# Patient Record
Sex: Female | Born: 1975 | Hispanic: No | Smoking: Former smoker
Health system: Southern US, Community
[De-identification: ages and names within clinical notes are randomized; demographics above are authoritative.]

## PROBLEM LIST (undated history)

## (undated) DIAGNOSIS — F419 Anxiety disorder, unspecified: Secondary | ICD-10-CM

## (undated) DIAGNOSIS — F32A Depression, unspecified: Secondary | ICD-10-CM

## (undated) DIAGNOSIS — F329 Major depressive disorder, single episode, unspecified: Secondary | ICD-10-CM

---

## 1997-03-15 ENCOUNTER — Encounter (HOSPITAL_COMMUNITY): Admission: AD | Admit: 1997-03-15 | Discharge: 1997-03-25 | Payer: Self-pay | Admitting: Obstetrics

## 1997-03-23 ENCOUNTER — Inpatient Hospital Stay (HOSPITAL_COMMUNITY): Admission: AD | Admit: 1997-03-23 | Discharge: 1997-03-27 | Payer: Self-pay | Admitting: Obstetrics

## 1997-08-24 ENCOUNTER — Observation Stay (HOSPITAL_COMMUNITY): Admission: AD | Admit: 1997-08-24 | Discharge: 1997-08-25 | Payer: Self-pay | Admitting: Obstetrics

## 1997-08-24 ENCOUNTER — Encounter: Payer: Self-pay | Admitting: Obstetrics

## 1997-08-25 ENCOUNTER — Encounter: Payer: Self-pay | Admitting: Obstetrics

## 1997-10-06 ENCOUNTER — Ambulatory Visit (HOSPITAL_COMMUNITY): Admission: RE | Admit: 1997-10-06 | Discharge: 1997-10-06 | Payer: Self-pay | Admitting: *Deleted

## 1998-02-07 ENCOUNTER — Ambulatory Visit (HOSPITAL_COMMUNITY): Admission: RE | Admit: 1998-02-07 | Discharge: 1998-02-07 | Payer: Self-pay | Admitting: *Deleted

## 1998-02-07 ENCOUNTER — Encounter: Payer: Self-pay | Admitting: *Deleted

## 1998-03-12 ENCOUNTER — Inpatient Hospital Stay (HOSPITAL_COMMUNITY): Admission: AD | Admit: 1998-03-12 | Discharge: 1998-03-12 | Payer: Self-pay | Admitting: Obstetrics & Gynecology

## 1998-03-15 ENCOUNTER — Inpatient Hospital Stay (HOSPITAL_COMMUNITY): Admission: AD | Admit: 1998-03-15 | Discharge: 1998-03-17 | Payer: Self-pay | Admitting: *Deleted

## 1998-07-21 ENCOUNTER — Emergency Department (HOSPITAL_COMMUNITY): Admission: EM | Admit: 1998-07-21 | Discharge: 1998-07-21 | Payer: Self-pay | Admitting: Emergency Medicine

## 1998-11-10 ENCOUNTER — Emergency Department (HOSPITAL_COMMUNITY): Admission: EM | Admit: 1998-11-10 | Discharge: 1998-11-10 | Payer: Self-pay | Admitting: Emergency Medicine

## 1998-12-04 ENCOUNTER — Emergency Department (HOSPITAL_COMMUNITY): Admission: EM | Admit: 1998-12-04 | Discharge: 1998-12-04 | Payer: Self-pay | Admitting: Emergency Medicine

## 1999-01-26 ENCOUNTER — Other Ambulatory Visit: Admission: RE | Admit: 1999-01-26 | Discharge: 1999-01-26 | Payer: Self-pay | Admitting: *Deleted

## 2000-04-17 ENCOUNTER — Other Ambulatory Visit: Admission: RE | Admit: 2000-04-17 | Discharge: 2000-04-17 | Payer: Self-pay | Admitting: *Deleted

## 2000-10-29 ENCOUNTER — Other Ambulatory Visit: Admission: RE | Admit: 2000-10-29 | Discharge: 2000-10-29 | Payer: Self-pay | Admitting: *Deleted

## 2000-10-29 ENCOUNTER — Other Ambulatory Visit: Admission: RE | Admit: 2000-10-29 | Discharge: 2000-10-29 | Payer: Self-pay | Admitting: Family Medicine

## 2001-01-05 ENCOUNTER — Ambulatory Visit (HOSPITAL_COMMUNITY): Admission: RE | Admit: 2001-01-05 | Discharge: 2001-01-05 | Payer: Self-pay | Admitting: Family Medicine

## 2001-01-05 ENCOUNTER — Encounter: Payer: Self-pay | Admitting: Family Medicine

## 2001-09-11 ENCOUNTER — Inpatient Hospital Stay (HOSPITAL_COMMUNITY): Admission: AD | Admit: 2001-09-11 | Discharge: 2001-09-11 | Payer: Self-pay | Admitting: Obstetrics and Gynecology

## 2002-02-07 ENCOUNTER — Emergency Department (HOSPITAL_COMMUNITY): Admission: EM | Admit: 2002-02-07 | Discharge: 2002-02-07 | Payer: Self-pay | Admitting: Emergency Medicine

## 2002-02-07 ENCOUNTER — Encounter: Payer: Self-pay | Admitting: Emergency Medicine

## 2002-12-21 ENCOUNTER — Emergency Department (HOSPITAL_COMMUNITY): Admission: EM | Admit: 2002-12-21 | Discharge: 2002-12-21 | Payer: Self-pay | Admitting: *Deleted

## 2003-03-23 ENCOUNTER — Emergency Department (HOSPITAL_COMMUNITY): Admission: EM | Admit: 2003-03-23 | Discharge: 2003-03-23 | Payer: Self-pay | Admitting: Emergency Medicine

## 2003-11-15 ENCOUNTER — Inpatient Hospital Stay (HOSPITAL_COMMUNITY): Admission: AD | Admit: 2003-11-15 | Discharge: 2003-11-15 | Payer: Self-pay | Admitting: Obstetrics & Gynecology

## 2004-04-07 ENCOUNTER — Inpatient Hospital Stay (HOSPITAL_COMMUNITY): Admission: AD | Admit: 2004-04-07 | Discharge: 2004-04-07 | Payer: Self-pay | Admitting: Obstetrics and Gynecology

## 2004-04-23 ENCOUNTER — Emergency Department (HOSPITAL_COMMUNITY): Admission: EM | Admit: 2004-04-23 | Discharge: 2004-04-23 | Payer: Self-pay | Admitting: *Deleted

## 2004-05-22 ENCOUNTER — Inpatient Hospital Stay (HOSPITAL_COMMUNITY): Admission: AD | Admit: 2004-05-22 | Discharge: 2004-05-22 | Payer: Self-pay | Admitting: *Deleted

## 2004-06-29 ENCOUNTER — Emergency Department (HOSPITAL_COMMUNITY): Admission: EM | Admit: 2004-06-29 | Discharge: 2004-06-29 | Payer: Self-pay | Admitting: Emergency Medicine

## 2004-07-04 ENCOUNTER — Ambulatory Visit (HOSPITAL_COMMUNITY): Admission: RE | Admit: 2004-07-04 | Discharge: 2004-07-04 | Payer: Self-pay | Admitting: Obstetrics & Gynecology

## 2004-11-03 ENCOUNTER — Inpatient Hospital Stay (HOSPITAL_COMMUNITY): Admission: AD | Admit: 2004-11-03 | Discharge: 2004-11-05 | Payer: Self-pay | Admitting: Obstetrics & Gynecology

## 2004-11-27 ENCOUNTER — Emergency Department (HOSPITAL_COMMUNITY): Admission: EM | Admit: 2004-11-27 | Discharge: 2004-11-27 | Payer: Self-pay | Admitting: Emergency Medicine

## 2005-11-01 ENCOUNTER — Emergency Department (HOSPITAL_COMMUNITY): Admission: EM | Admit: 2005-11-01 | Discharge: 2005-11-01 | Payer: Self-pay | Admitting: Family Medicine

## 2008-08-12 ENCOUNTER — Inpatient Hospital Stay (HOSPITAL_COMMUNITY): Admission: AD | Admit: 2008-08-12 | Discharge: 2008-08-12 | Payer: Self-pay | Admitting: Obstetrics & Gynecology

## 2008-08-15 ENCOUNTER — Inpatient Hospital Stay (HOSPITAL_COMMUNITY): Admission: AD | Admit: 2008-08-15 | Discharge: 2008-08-15 | Payer: Self-pay | Admitting: Obstetrics & Gynecology

## 2008-08-15 ENCOUNTER — Encounter: Payer: Self-pay | Admitting: Obstetrics & Gynecology

## 2008-09-22 ENCOUNTER — Ambulatory Visit: Payer: Self-pay | Admitting: Obstetrics and Gynecology

## 2008-09-22 ENCOUNTER — Encounter: Payer: Self-pay | Admitting: Obstetrics and Gynecology

## 2010-02-13 ENCOUNTER — Inpatient Hospital Stay (HOSPITAL_COMMUNITY): Payer: Self-pay

## 2010-02-13 ENCOUNTER — Inpatient Hospital Stay (HOSPITAL_COMMUNITY)
Admission: AD | Admit: 2010-02-13 | Discharge: 2010-02-13 | Disposition: A | Discharge status: Home / Self Care | Payer: Self-pay | Source: Ambulatory Visit | Attending: Obstetrics and Gynecology | Admitting: Obstetrics and Gynecology

## 2010-02-13 DIAGNOSIS — R109 Unspecified abdominal pain: Secondary | ICD-10-CM

## 2010-02-13 DIAGNOSIS — N938 Other specified abnormal uterine and vaginal bleeding: Secondary | ICD-10-CM | POA: Insufficient documentation

## 2010-02-13 DIAGNOSIS — N949 Unspecified condition associated with female genital organs and menstrual cycle: Secondary | ICD-10-CM | POA: Insufficient documentation

## 2010-02-13 LAB — URINALYSIS, ROUTINE W REFLEX MICROSCOPIC
Bilirubin Urine: NEGATIVE
Ketones, ur: NEGATIVE mg/dL
Protein, ur: NEGATIVE mg/dL
Specific Gravity, Urine: 1.01 (ref 1.005–1.030)
pH: 6 (ref 5.0–8.0)

## 2010-02-13 LAB — URINE MICROSCOPIC-ADD ON

## 2010-02-13 LAB — WET PREP, GENITAL

## 2010-04-14 LAB — CBC
HCT: 40.7 % (ref 36.0–46.0)
Hemoglobin: 14.1 g/dL (ref 12.0–15.0)
Platelets: 209 10*3/uL (ref 150–400)
RDW: 13.5 % (ref 11.5–15.5)

## 2010-04-14 LAB — GC/CHLAMYDIA PROBE AMP, GENITAL: Chlamydia, DNA Probe: NEGATIVE

## 2010-04-14 LAB — WET PREP, GENITAL
Clue Cells Wet Prep HPF POC: NONE SEEN
Trich, Wet Prep: NONE SEEN
Yeast Wet Prep HPF POC: NONE SEEN

## 2010-05-25 NOTE — H&P (Signed)
NAME:  Sarah Turner, Sarah Turner                ACCOUNT NO.:  [id number]   MEDICAL RECORD NO.:  [id number]          PATIENT TYPE:  INP   LOCATION:  9164                          FACILITY:  [facility]   PHYSICIAN:  Roseanna Rainbow, M.D.DATE OF BIRTH:  1975-12-09   DATE OF ADMISSION:  11/03/2004  DATE OF DISCHARGE:                                HISTORY & PHYSICAL   CHIEF COMPLAINT:  The patient is a 35 year old para 2 with an estimated date  of confinement of October 30, with an intrauterine pregnancy at 39+ weeks,  complaining of regular uterine contractions.   HISTORY OF PRESENT ILLNESS:  Please see the above.   PAST OBSTETRICAL AND GYNECOLOGIC HISTORY:  1.  She has history of abnormal Pap smears.  2.  In 2000, she was delivered of a live-born female, weight 6 pounds 6      ounces, full term vaginal delivery.  3.  In March 1999, she was delivered of a live-born female, 7 pounds 15      ounces, full term by cesarean delivery secondary to a suspicious fetal      heart tracing.   PAST MEDICAL HISTORY:  Asthma.   PAST SURGICAL HISTORY:  Please see the above.   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  Does  not give any significant history of alcohol usage.  She recently stopped  using cigarettes.  She denies illicit drug use.   FAMILY HISTORY:  No major illnesses known.   ALLERGIES:  No known drug allergies.   MEDICATIONS:  Prenatal vitamins.   ANTEPARTUM COURSE/PROBLEMS/RISKS:  History of a previous cesarean delivery  with a successful VBAC.   PRENATAL SCREENS:  Antibody screen negative, blood type O positive,  hemoglobin 11.6, hematocrit 33.8, platelets 212,000.  GC and Chlamydia  negative.  GBS negative on October 12.  One hour GCT 90.  Hepatitis B  surface antigen negative.  HIV negative.  Pap smear within normal limits.  RPR nonreactive,  rubella immune, urine culture and sensitivity no growth.  Ultrasound on October 28, no previa, normal amniotic fluid,  mean gestational  age [redacted] weeks 6 days, normal anatomy and consistent with dates.   PHYSICAL EXAMINATION:  VITAL SIGNS:  Stable, afebrile, fetal heart tracing  reassuring, tocodynamometer monitor uterine contractions every 10 minutes.  GENERAL:  Well-developed, mild distress.  ABDOMEN:  Gravid.  STERILE VAGINAL EXAM PER THE RN:  4 cm dilated, 80% effaced.   ASSESSMENT:  1.  Intrauterine pregnancy at term.  2.  History of a previous cesarean delivery and successful VBAC, now with      late latent labor versus early active labor.  3.  Fetal heart tracing consistent with fetal well-being.   PLAN:  1.  Admission.  2.  Expectant management.      Roseanna Rainbow, M.D.  Electronically Signed     LAJ/MEDQ  D:  11/03/2004  T:  11/03/2004  Job:  [id number]

## 2011-06-05 ENCOUNTER — Inpatient Hospital Stay (HOSPITAL_COMMUNITY)
Admission: AD | Admit: 2011-06-05 | Discharge: 2011-06-05 | Disposition: A | Discharge status: Home / Self Care | Payer: Medicaid Other | Source: Ambulatory Visit | Attending: Obstetrics & Gynecology | Admitting: Obstetrics & Gynecology

## 2011-06-05 ENCOUNTER — Encounter (HOSPITAL_COMMUNITY): Payer: Self-pay

## 2011-06-05 ENCOUNTER — Inpatient Hospital Stay (HOSPITAL_COMMUNITY): Payer: Medicaid Other

## 2011-06-05 DIAGNOSIS — IMO0002 Reserved for concepts with insufficient information to code with codable children: Secondary | ICD-10-CM

## 2011-06-05 DIAGNOSIS — O26859 Spotting complicating pregnancy, unspecified trimester: Secondary | ICD-10-CM

## 2011-06-05 DIAGNOSIS — O021 Missed abortion: Secondary | ICD-10-CM | POA: Insufficient documentation

## 2011-06-05 HISTORY — DX: Major depressive disorder, single episode, unspecified: F32.9

## 2011-06-05 HISTORY — DX: Anxiety disorder, unspecified: F41.9

## 2011-06-05 HISTORY — DX: Depression, unspecified: F32.A

## 2011-06-05 LAB — CBC
MCV: 90 fL (ref 78.0–100.0)
Platelets: 203 10*3/uL (ref 150–400)
RBC: 4.7 MIL/uL (ref 3.87–5.11)
RDW: 13.8 % (ref 11.5–15.5)
WBC: 7.5 10*3/uL (ref 4.0–10.5)

## 2011-06-05 LAB — URINALYSIS, ROUTINE W REFLEX MICROSCOPIC
Bilirubin Urine: NEGATIVE
Ketones, ur: NEGATIVE mg/dL
Nitrite: NEGATIVE
Protein, ur: NEGATIVE mg/dL
Specific Gravity, Urine: >1.03 — ABNORMAL HIGH (ref 1.005–1.030)
Urobilinogen, UA: 0.2 mg/dL (ref 0.0–1.0)

## 2011-06-05 LAB — WET PREP, GENITAL
Trich, Wet Prep: NONE SEEN
Yeast Wet Prep HPF POC: NONE SEEN

## 2011-06-05 LAB — URINE MICROSCOPIC-ADD ON

## 2011-06-05 MED ORDER — PROMETHAZINE HCL 25 MG PO TABS: 12.5000 mg | ORAL_TABLET | Freq: Four times a day (QID) | ORAL | Status: DC | PRN

## 2011-06-05 MED ORDER — HYDROCODONE-ACETAMINOPHEN 5-325 MG PO TABS: 2.0000 | ORAL_TABLET | ORAL | Status: AC | PRN

## 2011-06-05 MED ORDER — IBUPROFEN 600 MG PO TABS: 600.0000 mg | ORAL_TABLET | Freq: Four times a day (QID) | ORAL | Status: AC | PRN

## 2011-06-05 NOTE — MAU Note (Signed)
Called to [facility] for follow up appt. Scheduled June 17,2013 @ 2:15

## 2011-06-05 NOTE — Discharge Instructions (Signed)
Follow up with the GYN Clinic in 2 weeks. 06/24/11 at 2:15 pm. Return here sooner for any problems.

## 2011-06-05 NOTE — MAU Note (Signed)
Pt states began spotting yesterday night, pink d/c mixed with watery discharge. Denies itching or burning, no uti s/s. Has had intermittent lower abd pain, sporadic, denies pain at present. Last intercourse 4 days ago.

## 2011-06-05 NOTE — MAU Note (Signed)
Unable to obtain FHT's with doppler

## 2011-06-05 NOTE — MAU Provider Note (Signed)
History     CSN: [id number]  Arrival date & time 06/05/11  0943   None     Chief Complaint  Patient presents with  . Vaginal Bleeding    HPI Sarah Turner is a 36 y.o. female @ [redacted]w[redacted]d gestation who presents to MAU for bleeding in pregnancy. The bleeding started last night.started as spotting and has continued this morning with abdominal cramping. Last sexual intercourse 3 days ago. Last pap smear in March was normal. Current sex partner x 18 years. No history of STI's. The history was provided by the patient.  Past Medical History  Diagnosis Date  . Hx successful VBAC (vaginal birth after cesarean), currently pregnant   . Vitamin d deficiency   . Asthma   . Anxiety   . Depression     Past Surgical History  Procedure Date  . Cesarean section     fetal distress    Family History  Problem Relation Age of Onset  . Anesthesia problems Neg Hx   . Hypotension Neg Hx   . Malignant hyperthermia Neg Hx   . Pseudochol deficiency Neg Hx     History  Substance Use Topics  . Smoking status: Former Games developer  . Smokeless tobacco: Never Used  . Alcohol Use: No    OB History    Grav Para Term Preterm Abortions TAB SAB Ect Mult Living   14 3 3  10  10   3       Review of Systems  Constitutional: Positive for fatigue. Negative for fever, chills and diaphoresis.  HENT: Negative for ear pain, congestion, sore throat, facial swelling, neck pain, neck stiffness, dental problem and sinus pressure.   Eyes: Negative for photophobia, pain, discharge and visual disturbance.  Respiratory: Negative for cough, chest tightness and wheezing.   Cardiovascular: Negative for chest pain and palpitations.  Gastrointestinal: Positive for abdominal pain (cramping). Negative for nausea, vomiting, diarrhea, constipation and abdominal distention.  Genitourinary: Positive for vaginal bleeding and vaginal discharge. Negative for dysuria, urgency, frequency, flank pain and difficulty urinating.    Musculoskeletal: Negative for myalgias, back pain and gait problem.  Skin: Negative for color change and rash.  Neurological: Negative for dizziness, speech difficulty, weakness, light-headedness, numbness and headaches.  Psychiatric/Behavioral: Negative for confusion and agitation. The patient is not nervous/anxious.     Allergies  Review of patient's allergies indicates no known allergies.  Home Medications  No current outpatient prescriptions on file.  BP 110/61  Pulse 88  Temp(Src) 98.3 F (36.8 C) (Oral)  Resp 16  Ht 5\' 6"  (1.676 m)  Wt 180 lb 2 oz (81.704 kg)  BMI 29.07 kg/m2  SpO2 100%  LMP 02/03/2011  Physical Exam  Nursing note and vitals reviewed. Constitutional: She is oriented to person, place, and time. She appears well-developed and well-nourished.  HENT:  Head: Normocephalic.  Eyes: EOM are normal.  Neck: Neck supple.  Cardiovascular: Normal rate.   Pulmonary/Chest: Effort normal.  Abdominal: Soft. There is no tenderness.  Musculoskeletal: Normal range of motion.  Neurological: She is alert and oriented to person, place, and time. No cranial nerve deficit.  Skin: Skin is warm and dry.  Psychiatric: She has a normal mood and affect. Her behavior is normal. Judgment and thought content normal.   Results for orders placed during the hospital encounter of 06/05/11 (from the past 24 hour(s))  URINALYSIS, ROUTINE W REFLEX MICROSCOPIC     Status: Abnormal   Collection Time   06/05/11  9:45 AM  Component Value Range   Color, Urine YELLOW  YELLOW    APPearance CLOUDY (*) CLEAR    Specific Gravity, Urine >1.030 (*) 1.005 - 1.030    pH 5.5  5.0 - 8.0    Glucose, UA NEGATIVE  NEGATIVE (mg/dL)   Hgb urine dipstick LARGE (*) NEGATIVE    Bilirubin Urine NEGATIVE  NEGATIVE    Ketones, ur NEGATIVE  NEGATIVE (mg/dL)   Protein, ur NEGATIVE  NEGATIVE (mg/dL)   Urobilinogen, UA 0.2  0.0 - 1.0 (mg/dL)   Nitrite NEGATIVE  NEGATIVE    Leukocytes, UA TRACE (*)  NEGATIVE   URINE MICROSCOPIC-ADD ON     Status: Abnormal   Collection Time   06/05/11  9:45 AM      Component Value Range   Squamous Epithelial / LPF RARE  RARE    WBC, UA 3-6  <3 (WBC/hpf)   Bacteria, UA MANY (*) RARE   POCT PREGNANCY, URINE     Status: Abnormal   Collection Time   06/05/11 11:06 AM      Component Value Range   Preg Test, Ur POSITIVE (*) NEGATIVE   CBC     Status: Normal   Collection Time   06/05/11 11:40 AM      Component Value Range   WBC 7.5  4.0 - 10.5 (K/uL)   RBC 4.70  3.87 - 5.11 (MIL/uL)   Hemoglobin 14.5  12.0 - 15.0 (g/dL)   HCT 40.9  81.1 - 91.4 (%)   MCV 90.0  78.0 - 100.0 (fL)   MCH 30.9  26.0 - 34.0 (pg)   MCHC 34.3  30.0 - 36.0 (g/dL)   RDW 78.2  95.6 - 21.3 (%)   Platelets 203  150 - 400 (K/uL)  WET PREP, GENITAL     Status: Abnormal   Collection Time   06/05/11 12:38 PM      Component Value Range   Yeast Wet Prep HPF POC NONE SEEN  NONE SEEN    Trich, Wet Prep NONE SEEN  NONE SEEN    Clue Cells Wet Prep HPF POC FEW (*) NONE SEEN    WBC, Wet Prep HPF POC FEW (*) NONE SEEN     ED Course  Procedures Ultrasound today shows a 9 week 2 day IUFD  MDM: discussed with Dr. Debroah Loop.    Assessment: IUFD @ 9 weeks and 2 days   Plan: Discussed options in detail with patient. She states that she has done expectant management with other miscarriages and wants to do that again.   Rx Vicodin  Rx Phenergan  Follow up in GYN office in 2 weeks  Return here for any problems

## 2011-06-06 LAB — GC/CHLAMYDIA PROBE AMP, GENITAL: GC Probe Amp, Genital: NEGATIVE

## 2011-06-07 ENCOUNTER — Inpatient Hospital Stay (HOSPITAL_COMMUNITY)
Admission: AD | Admit: 2011-06-07 | Discharge: 2011-06-07 | Disposition: A | Discharge status: Home / Self Care | Payer: Medicaid Other | Source: Ambulatory Visit | Attending: Obstetrics & Gynecology | Admitting: Obstetrics & Gynecology

## 2011-06-07 ENCOUNTER — Encounter (HOSPITAL_COMMUNITY): Payer: Self-pay | Admitting: *Deleted

## 2011-06-07 DIAGNOSIS — O021 Missed abortion: Secondary | ICD-10-CM

## 2011-06-07 DIAGNOSIS — R109 Unspecified abdominal pain: Secondary | ICD-10-CM | POA: Insufficient documentation

## 2011-06-07 DIAGNOSIS — IMO0002 Reserved for concepts with insufficient information to code with codable children: Secondary | ICD-10-CM | POA: Diagnosis present

## 2011-06-07 LAB — CBC
Hemoglobin: 13.1 g/dL (ref 12.0–15.0)
Platelets: 196 10*3/uL (ref 150–400)
RBC: 4.32 MIL/uL (ref 3.87–5.11)
WBC: 11.5 10*3/uL — ABNORMAL HIGH (ref 4.0–10.5)

## 2011-06-07 MED ORDER — HYDROCODONE-ACETAMINOPHEN 5-325 MG PO TABS
2.0000 | ORAL_TABLET | Freq: Once | ORAL | Status: AC
  Administered 2011-06-07: 1 via ORAL
  Filled 2011-06-07: qty 1

## 2011-06-07 MED ORDER — HYDROCODONE-ACETAMINOPHEN 5-325 MG PO TABS
1.0000 | ORAL_TABLET | Freq: Once | ORAL | Status: AC
  Administered 2011-06-07: 1 via ORAL
  Filled 2011-06-07: qty 1

## 2011-06-07 NOTE — Discharge Instructions (Signed)
Miscarriage  An early pregnancy loss or spontaneous abortion (miscarriage) is a common problem. This usually happens when the pregnancy is not developing normally. It is very unlikely that you or your partner did anything to cause this, although cigarette smoking, a sexually transmitted disease, excessive alcohol use, or drug abuse can increase the risk. Other causes are:   Abnormalities of the uterus.   Hormone or medical problems.   Trauma or genetic (chromosome) problems.  Having a miscarriage does not change your chances of having a normal pregnancy in the future. Your caregiver will advise you when it is safe to try to get pregnant again.  AFTER A MISCARRIAGE   A miscarriage is inevitable when there is continual, heavy vaginal bleeding; cramping; dilation of the cervix; or passing of any pregnancy tissue. Bleeding and cramping will usually continue until all the tissue has been removed from the womb (uterus).   Often the uterus does not clean itself out completely. A medication or a D&C procedure is needed to loosen or remove the pregnancy tissue from the uterus. A D&C scrapes or suctions the tissue out.   If you are RH negative, you may need to have Rh immune globulin to avoid Rh problems.   You may be given medication to fight an infection if the miscarriage was due to an infection.  HOME CARE INSTRUCTIONS    You should rest in bed for the next 2 to 3 days.   Do not take tub baths or put anything in your vagina, including tampons or a douche.   Do not have sex until your caregiver approves.   Avoid exercise or heavy activities until directed by your caregiver.   Save any vaginal discharge that looks like tissue. Ask your caregiver if he or she wants to inspect the discharge.   If you and your partner are having problems with guilt or grieving, talk to your caregiver or get counseling to help you understand and cope with your pregnancy loss.   Allow enough time to grieve before trying to get  pregnant again.  SEEK IMMEDIATE MEDICAL CARE IF:    You have persistent heavy bleeding or a bad smelling vaginal discharge.   You have continued abdominal or pelvic pain.   You have an oral temperature above 102 F (38.9 C), not controlled by medicine.   You have severe weakness, fainting, or keep throwing up (vomiting).   You develop chills.   You are experiencing domestic violence.  MAKE SURE YOU:    Understand these instructions.   Will watch your condition.   Will get help right away if you are not doing well or get worse.  Document Released: 02/01/2004 Document Revised: 12/13/2010 Document Reviewed: 12/17/2007  ExitCare Patient Information 2012 [employer].

## 2011-06-07 NOTE — MAU Provider Note (Signed)
Sarah Turner [id number] y.o.[id number] @[redacted]w[redacted]d  by LMP Chief Complaint  Patient presents with  . Abdominal Pain     First Provider Initiated Contact with Patient 06/07/11 2151      SUBJECTIVE  HPI: Bleeding since 06/05/2011 and today began cramping. Denies passing clots. May have passed tissue but describes it as stringy blood. Bleeding is less heavy than a period. Had ultrasound diagnosis of embryonic demise 9 week 2 days on 06/05/2011. States she was offered options of expectant management, Cytotec or D&C. She elected expectant management then but now "just wants it out."  She doesn't want Cytotec because she heard it causes a lot of pain. She is requesting D&C Had Vicodin about noon and requests pain med.   Past Medical History  Diagnosis Date  . Hx successful VBAC (vaginal birth after cesarean), currently pregnant   . Vitamin d deficiency   . Asthma   . Anxiety   . Depression    Past Surgical History  Procedure Date  . Cesarean section     fetal distress   History   Social History  . Marital Status: Legally Separated    Spouse Name: N/A    Number of Children: N/A  . Years of Education: N/A   Occupational History  . Not on file.   Social History Main Topics  . Smoking status: Former Games developer  . Smokeless tobacco: Never Used  . Alcohol Use: No  . Drug Use: No  . Sexually Active: Yes    Birth Control/ Protection: None   Other Topics Concern  . Not on file   Social History Narrative  . No narrative on file   No current facility-administered medications on file prior to encounter.   Current Outpatient Prescriptions on File Prior to Encounter  Medication Sig Dispense Refill  . acetaminophen (TYLENOL) 500 MG tablet Take 500 mg by mouth every 6 (six) hours as needed. headache      . HYDROcodone-acetaminophen (NORCO) 5-325 MG per tablet Take 2 tablets by mouth every 4 (four) hours as needed for pain.  10 tablet  0  . ibuprofen (ADVIL,MOTRIN) 600 MG tablet Take 1 tablet (600  mg total) by mouth every 6 (six) hours as needed for pain.  30 tablet  0  . promethazine (PHENERGAN) 25 MG tablet Take 0.5 tablets (12.5 mg total) by mouth every 6 (six) hours as needed for nausea.  20 tablet  0   No Known Allergies  ROS: Pertinent items in HPI  OBJECTIVE Blood pressure 114/65, pulse 75, temperature 98.5 F (36.9 C), temperature source Oral, resp. rate 22, height 5\' 6"  (1.676 m), weight 81.647 kg (180 lb), last menstrual period 02/03/2011.  GENERAL: Well-developed, well-nourished female in no acute distress.  HEENT: Normocephalic, good dentition HEART: normal rate RESP: normal effort ABDOMEN: Soft, nontender EXTREMITIES: Nontender, no edema NEURO: Alert and oriented SPECULUM EXAM: NEFG, sm-mod blood noted, cervix clean BIMANUAL: cervix ext 1/ long; uterus c/w 9 wk and sl tender   LAB RESULTS A pos CBC    Component Value Date/Time   WBC 11.5* 06/07/2011 2206   RBC 4.32 06/07/2011 2206   HGB 13.1 06/07/2011 2206   HCT 39.2 06/07/2011 2206   PLT 196 06/07/2011 2206   MCV 90.7 06/07/2011 2206   MCH 30.3 06/07/2011 2206   MCHC 33.4 06/07/2011 2206   RDW 13.4 06/07/2011 2206        IMAGING 06/05/11 9w 2d fetal demise  ASSESSMENT Multip with EPF, hemodynamically stable   PLAN Dr. Debroah Loop  saw pt.     POE,DEIRDRE 06/07/2011 10:12 PM  Patient requests scheduled dilation and curettage in AM. Declines expectant management and Cytotec.The procedure and risks of anesthesia, bleeding, pain,uterine or pelvic organ damage, infection were discussed and her questions were answered. She will present at 0600 06/08/11 for surgery at 0730.  Torah Pinnock 06/07/2011 10:42 PM

## 2011-06-07 NOTE — MAU Note (Signed)
C/o increased cramping and increased pain; was told Tuesday that she was in the process of misscarrying;

## 2011-06-08 ENCOUNTER — Telehealth (HOSPITAL_COMMUNITY): Payer: Self-pay

## 2011-06-08 ENCOUNTER — Ambulatory Visit: Admit: 2011-06-08 | Payer: Self-pay | Admitting: Obstetrics & Gynecology

## 2011-06-08 SURGERY — DILATION AND EVACUATION, UTERUS
Anesthesia: Choice

## 2011-06-08 NOTE — MAU Provider Note (Signed)
Scheduled for D&C

## 2011-06-24 ENCOUNTER — Ambulatory Visit: Payer: Self-pay | Admitting: Family

## 2011-07-17 ENCOUNTER — Ambulatory Visit (INDEPENDENT_AMBULATORY_CARE_PROVIDER_SITE_OTHER): Payer: Medicaid Other | Admitting: Obstetrics & Gynecology

## 2011-07-17 ENCOUNTER — Encounter: Payer: Self-pay | Admitting: Obstetrics & Gynecology

## 2011-07-17 VITALS — BP 124/88 | HR 74 | Temp 98.9°F | Resp 20 | Ht 66.0 in | Wt 176.1 lb

## 2011-07-17 DIAGNOSIS — O021 Missed abortion: Secondary | ICD-10-CM

## 2011-07-17 DIAGNOSIS — Z30430 Encounter for insertion of intrauterine contraceptive device: Secondary | ICD-10-CM

## 2011-07-17 DIAGNOSIS — IMO0002 Reserved for concepts with insufficient information to code with codable children: Secondary | ICD-10-CM

## 2011-07-17 DIAGNOSIS — Z975 Presence of (intrauterine) contraceptive device: Secondary | ICD-10-CM

## 2011-07-17 LAB — POCT PREGNANCY, URINE: Preg Test, Ur: NEGATIVE

## 2011-07-17 MED ORDER — LEVONORGESTREL 20 MCG/24HR IU IUD: 1.0000 | INTRAUTERINE_SYSTEM | Freq: Once | INTRAUTERINE | Status: AC

## 2011-07-17 NOTE — Patient Instructions (Signed)

## 2011-07-17 NOTE — Progress Notes (Signed)
Pt here for follow up of miscarriage- seen in MAU on 06/07/11.  She had unprotected intercourse 1 wk after miscarriage and used Plan B.  She had a light period on 07/10/11 which lasted 5 days, then had heavier bleeding on 07/15/11.  Pt desires surgical consult for tubal sterilization. Patient's last menstrual period was 07/10/2011. [id number] Patient had a spontaneous miscarriage at the end of May. She's had many spontaneous miscarriages and she would like to have effective for control. She has considered tubal ligation but also has considered Mirena. We discussed both methods of contraception. The risk and benefits were discussed. She would like have a Mirena placed. The procedure of insertion was explained. The risk of pain bleeding uterine perforation infection and possible future pregnancy were discussed. She had her questions about the possibility of expulsion answered. Past Medical History  Diagnosis Date  . Hx successful VBAC (vaginal birth after cesarean), currently pregnant   . Vitamin d deficiency   . Asthma   . Anxiety   . Depression    Past Surgical History  Procedure Date  . Cesarean section 1999    fetal distress   Allergies  Allergen Reactions  . Amoxicillin Itching   History   Social History  . Marital Status: Legally Separated    Spouse Name: N/A    Number of Children: N/A  . Years of Education: N/A   Occupational History  . Not on file.   Social History Main Topics  . Smoking status: Current Everyday Smoker -- 0.5 packs/day for .5 years  . Smokeless tobacco: Never Used  . Alcohol Use: No     occasionally  . Drug Use: No  . Sexually Active: Yes    Birth Control/ Protection: None     desires tubal sterilization   Other Topics Concern  . Not on file   Social History Narrative  . No narrative on file   review of systems: The patient is menstruating today. Filed Vitals:   07/17/11 1544  BP: 124/88  Pulse: 74  Temp: 98.9 F (37.2 C)  Resp: 20   No acute  distress normal affect Abdomen soft nontender no mass Pelvic: External genitalia vagina appeared normal. Moderate amount of menstrual blood. Uterus normal size no adnexal mass or tenderness. Cervix was prepped with Betadine and grasped with a single-tooth tenaculum. Uterus sounded to 8 cm. The Mirena was placed in the usual fashion without difficulty. The string was trimmed to 3 cm.  Impression: 6 weeks after spontaneous miscarriage. Patient requested on term contraception and Mirena was placed. She signed consent adequate timeout was performed.  Plan return in one month for IUD check.  Dr. Scheryl Darter 07/17/2011 4:38 PM

## 2011-08-29 ENCOUNTER — Encounter: Payer: Self-pay | Admitting: Obstetrics & Gynecology

## 2011-08-29 ENCOUNTER — Ambulatory Visit (INDEPENDENT_AMBULATORY_CARE_PROVIDER_SITE_OTHER): Payer: Medicaid Other | Admitting: Obstetrics & Gynecology

## 2011-08-29 VITALS — BP 109/71 | HR 82 | Temp 97.2°F | Ht 66.0 in | Wt 169.3 lb

## 2011-08-29 DIAGNOSIS — B9689 Other specified bacterial agents as the cause of diseases classified elsewhere: Secondary | ICD-10-CM

## 2011-08-29 DIAGNOSIS — N760 Acute vaginitis: Secondary | ICD-10-CM

## 2011-08-29 DIAGNOSIS — Z30431 Encounter for routine checking of intrauterine contraceptive device: Secondary | ICD-10-CM | POA: Insufficient documentation

## 2011-08-29 DIAGNOSIS — A499 Bacterial infection, unspecified: Secondary | ICD-10-CM

## 2011-08-29 MED ORDER — METRONIDAZOLE 500 MG PO TABS: 500.0000 mg | ORAL_TABLET | Freq: Two times a day (BID) | ORAL | Status: AC

## 2011-08-29 NOTE — Progress Notes (Signed)
  Subjective:    Patient ID: Sarah Turner, female    DOB: 05-31-1975, 36 y.o.   MRN: [id number]  [id number] Patient's last menstrual period was 07/17/2011. The patient had a Mirena placed at her last visit. She comes for followup. She's had some irregular bleeding but it has been light period no problems with pain. She has had a vaginal odor that she notices when she urinates. She's been treated for bacterial vaginosis in the past. Allergies  Allergen Reactions  . Amoxicillin Itching   Current Outpatient Prescriptions on File Prior to Visit  Medication Sig Dispense Refill  . citalopram (CELEXA) 20 MG tablet Take 40 mg by mouth daily.       Marland Kitchen levonorgestrel (MIRENA) 20 MCG/24HR IUD 1 Intra Uterine Device (1 each total) by Intrauterine route once.  1 each  0  . acetaminophen (TYLENOL) 500 MG tablet Take 500 mg by mouth every 6 (six) hours as needed. headache       Past Medical History  Diagnosis Date  . Hx successful VBAC (vaginal birth after cesarean), currently pregnant   . Vitamin d deficiency   . Asthma   . Anxiety   . Depression    '     Review of Systems  Genitourinary: Positive for vaginal bleeding (light). Negative for pelvic pain. Vaginal discharge: Vaginal odor.       Objective:   Physical Exam  Constitutional: She appears well-developed. No distress.  Pulmonary/Chest: Effort normal. No respiratory distress.  Genitourinary: Vagina normal and uterus normal. Vaginal discharge: slight vaginal bleeding.       IUD strings 2 cm long  Skin: Skin is warm and dry.  Psychiatric: She has a normal mood and affect.          Assessment & Plan:  Normal followup exam after Mirena insertion. Patient has symptoms of bacterial vaginosis. A prescribed Flagyl 500 mg by mouth twice a day for 7 days. She can return for routine gynecologic followup.  Dr. Scheryl Darter 08/29/2011 4:23 PM

## 2011-08-29 NOTE — Patient Instructions (Addendum)
Bacterial Vaginosis Bacterial vaginosis (BV) is a vaginal infection where the normal balance of bacteria in the vagina is disrupted. The normal balance is then replaced by an overgrowth of certain bacteria. There are several different kinds of bacteria that can cause BV. BV is the most common vaginal infection in women of childbearing age. CAUSES   The cause of BV is not fully understood. BV develops when there is an increase or imbalance of harmful bacteria.   Some activities or behaviors can upset the normal balance of bacteria in the vagina and put women at increased risk including:   Having a new sex partner or multiple sex partners.   Douching.   Using an intrauterine device (IUD) for contraception.   It is not clear what role sexual activity plays in the development of BV. However, women that have never had sexual intercourse are rarely infected with BV.  Women do not get BV from toilet seats, bedding, swimming pools or from touching objects around them.  SYMPTOMS   Grey vaginal discharge.   A fish-like odor with discharge, especially after sexual intercourse.   Itching or burning of the vagina and vulva.   Burning or pain with urination.   Some women have no signs or symptoms at all.  DIAGNOSIS  Your caregiver must examine the vagina for signs of BV. Your caregiver will perform lab tests and look at the sample of vaginal fluid through a microscope. They will look for bacteria and abnormal cells (clue cells), a pH test higher than 4.5, and a positive amine test all associated with BV.  RISKS AND COMPLICATIONS   Pelvic inflammatory disease (PID).   Infections following gynecology surgery.   Developing HIV.   Developing herpes virus.  TREATMENT  Sometimes BV will clear up without treatment. However, all women with symptoms of BV should be treated to avoid complications, especially if gynecology surgery is planned. Female partners generally do not need to be treated. However,  BV may spread between female sex partners so treatment is helpful in preventing a recurrence of BV.   BV may be treated with antibiotics. The antibiotics come in either pill or vaginal cream forms. Either can be used with nonpregnant or pregnant women, but the recommended dosages differ. These antibiotics are not harmful to the baby.   BV can recur after treatment. If this happens, a second round of antibiotics will often be prescribed.   Treatment is important for pregnant women. If not treated, BV can cause a premature delivery, especially for a pregnant woman who had a premature birth in the past. All pregnant women who have symptoms of BV should be checked and treated.   For chronic reoccurrence of BV, treatment with a type of prescribed gel vaginally twice a week is helpful.  HOME CARE INSTRUCTIONS   Finish all medication as directed by your caregiver.   Do not have sex until treatment is completed.   Tell your sexual partner that you have a vaginal infection. They should see their caregiver and be treated if they have problems, such as a mild rash or itching.   Practice safe sex. Use condoms. Only have 1 sex partner.  PREVENTION  Basic prevention steps can help reduce the risk of upsetting the natural balance of bacteria in the vagina and developing BV:  Do not have sexual intercourse (be abstinent).   Do not douche.   Use all of the medicine prescribed for treatment of BV, even if the signs and symptoms go away.     Tell your sex partner if you have BV. That way, they can be treated, if needed, to prevent reoccurrence.  SEEK MEDICAL CARE IF:   Your symptoms are not improving after 3 days of treatment.   You have increased discharge, pain, or fever.  MAKE SURE YOU:   Understand these instructions.   Will watch your condition.   Will get help right away if you are not doing well or get worse.  FOR MORE INFORMATION  Division of STD Prevention (DSTDP), Centers for Disease  Control and Prevention: www.cdc.gov/std American Social Health Association (ASHA): www.ashastd.org  Document Released: 12/24/2004 Document Revised: 12/13/2010 Document Reviewed: 06/16/2008 ExitCare Patient Information 2012 [employer]. 

## 2011-08-30 ENCOUNTER — Emergency Department (HOSPITAL_COMMUNITY)
Admission: EM | Admit: 2011-08-30 | Discharge: 2011-08-30 | Discharge status: Against Medical Advice | Payer: Medicaid Other | Attending: Emergency Medicine | Admitting: Emergency Medicine

## 2011-08-30 DIAGNOSIS — R209 Unspecified disturbances of skin sensation: Secondary | ICD-10-CM | POA: Insufficient documentation

## 2011-08-30 NOTE — ED Notes (Signed)
Pt seen leaving ED, called by Triage RN, no answer.

## 2012-05-30 ENCOUNTER — Emergency Department (HOSPITAL_COMMUNITY)
Admission: EM | Admit: 2012-05-30 | Discharge: 2012-05-30 | Disposition: A | Discharge status: Home / Self Care | Payer: No Typology Code available for payment source | Attending: Emergency Medicine | Admitting: Emergency Medicine

## 2012-05-30 ENCOUNTER — Encounter (HOSPITAL_COMMUNITY): Payer: Self-pay

## 2012-05-30 ENCOUNTER — Emergency Department (HOSPITAL_COMMUNITY): Payer: No Typology Code available for payment source

## 2012-05-30 DIAGNOSIS — J45909 Unspecified asthma, uncomplicated: Secondary | ICD-10-CM | POA: Insufficient documentation

## 2012-05-30 DIAGNOSIS — S335XXA Sprain of ligaments of lumbar spine, initial encounter: Secondary | ICD-10-CM | POA: Insufficient documentation

## 2012-05-30 DIAGNOSIS — Y9389 Activity, other specified: Secondary | ICD-10-CM | POA: Insufficient documentation

## 2012-05-30 DIAGNOSIS — S8000XA Contusion of unspecified knee, initial encounter: Secondary | ICD-10-CM | POA: Insufficient documentation

## 2012-05-30 DIAGNOSIS — S39012A Strain of muscle, fascia and tendon of lower back, initial encounter: Secondary | ICD-10-CM

## 2012-05-30 DIAGNOSIS — F17200 Nicotine dependence, unspecified, uncomplicated: Secondary | ICD-10-CM | POA: Insufficient documentation

## 2012-05-30 DIAGNOSIS — Z8639 Personal history of other endocrine, nutritional and metabolic disease: Secondary | ICD-10-CM | POA: Insufficient documentation

## 2012-05-30 DIAGNOSIS — S139XXA Sprain of joints and ligaments of unspecified parts of neck, initial encounter: Secondary | ICD-10-CM | POA: Insufficient documentation

## 2012-05-30 DIAGNOSIS — Z862 Personal history of diseases of the blood and blood-forming organs and certain disorders involving the immune mechanism: Secondary | ICD-10-CM | POA: Insufficient documentation

## 2012-05-30 DIAGNOSIS — Z880 Allergy status to penicillin: Secondary | ICD-10-CM | POA: Insufficient documentation

## 2012-05-30 DIAGNOSIS — Z8659 Personal history of other mental and behavioral disorders: Secondary | ICD-10-CM | POA: Insufficient documentation

## 2012-05-30 DIAGNOSIS — S8002XA Contusion of left knee, initial encounter: Secondary | ICD-10-CM

## 2012-05-30 DIAGNOSIS — Y92410 Unspecified street and highway as the place of occurrence of the external cause: Secondary | ICD-10-CM | POA: Insufficient documentation

## 2012-05-30 DIAGNOSIS — S161XXA Strain of muscle, fascia and tendon at neck level, initial encounter: Secondary | ICD-10-CM

## 2012-05-30 MED ORDER — IBUPROFEN 800 MG PO TABS: 800.0000 mg | ORAL_TABLET | Freq: Three times a day (TID) | ORAL | Status: DC | PRN

## 2012-05-30 MED ORDER — HYDROCODONE-ACETAMINOPHEN 5-325 MG PO TABS: 1.0000 | ORAL_TABLET | Freq: Four times a day (QID) | ORAL | Status: DC | PRN

## 2012-05-30 MED ORDER — HYDROCODONE-ACETAMINOPHEN 5-325 MG PO TABS
1.0000 | ORAL_TABLET | Freq: Once | ORAL | Status: AC
  Administered 2012-05-30: 1 via ORAL
  Filled 2012-05-30: qty 1

## 2012-05-30 NOTE — ED Notes (Signed)
Pt states she has a ride home. 
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
## 2012-05-30 NOTE — ED Provider Notes (Signed)
Medical screening examination/treatment/procedure(s) were performed by non-physician practitioner and as supervising physician I was immediately available for consultation/collaboration.    Celene Kras, MD 05/30/12 [phone]
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
## 2013-11-08 ENCOUNTER — Encounter (HOSPITAL_COMMUNITY): Payer: Self-pay
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
## 2014-09-13 ENCOUNTER — Encounter (HOSPITAL_COMMUNITY): Payer: Self-pay | Admitting: Emergency Medicine

## 2014-09-13 ENCOUNTER — Emergency Department (HOSPITAL_COMMUNITY)
Admission: EM | Admit: 2014-09-13 | Discharge: 2014-09-13 | Disposition: A | Discharge status: Home / Self Care | Payer: No Typology Code available for payment source | Attending: Emergency Medicine | Admitting: Emergency Medicine

## 2014-09-13 DIAGNOSIS — S39012A Strain of muscle, fascia and tendon of lower back, initial encounter: Secondary | ICD-10-CM | POA: Insufficient documentation

## 2014-09-13 DIAGNOSIS — Y9389 Activity, other specified: Secondary | ICD-10-CM | POA: Diagnosis not present

## 2014-09-13 DIAGNOSIS — Z880 Allergy status to penicillin: Secondary | ICD-10-CM | POA: Diagnosis not present

## 2014-09-13 DIAGNOSIS — T148XXA Other injury of unspecified body region, initial encounter: Secondary | ICD-10-CM

## 2014-09-13 DIAGNOSIS — Z720 Tobacco use: Secondary | ICD-10-CM | POA: Diagnosis not present

## 2014-09-13 DIAGNOSIS — J45909 Unspecified asthma, uncomplicated: Secondary | ICD-10-CM | POA: Insufficient documentation

## 2014-09-13 DIAGNOSIS — Y998 Other external cause status: Secondary | ICD-10-CM | POA: Insufficient documentation

## 2014-09-13 DIAGNOSIS — Z8659 Personal history of other mental and behavioral disorders: Secondary | ICD-10-CM | POA: Diagnosis not present

## 2014-09-13 DIAGNOSIS — Y92410 Unspecified street and highway as the place of occurrence of the external cause: Secondary | ICD-10-CM | POA: Insufficient documentation

## 2014-09-13 DIAGNOSIS — Z8639 Personal history of other endocrine, nutritional and metabolic disease: Secondary | ICD-10-CM | POA: Insufficient documentation

## 2014-09-13 DIAGNOSIS — S199XXA Unspecified injury of neck, initial encounter: Secondary | ICD-10-CM | POA: Diagnosis not present

## 2014-09-13 DIAGNOSIS — S3992XA Unspecified injury of lower back, initial encounter: Secondary | ICD-10-CM | POA: Diagnosis present

## 2014-09-13 MED ORDER — IBUPROFEN 800 MG PO TABS: 800.0000 mg | ORAL_TABLET | Freq: Three times a day (TID) | ORAL | Status: AC

## 2014-09-13 MED ORDER — IBUPROFEN 800 MG PO TABS
800.0000 mg | ORAL_TABLET | Freq: Once | ORAL | Status: AC
  Administered 2014-09-13: 800 mg via ORAL
  Filled 2014-09-13: qty 1

## 2014-09-13 MED ORDER — METHOCARBAMOL 500 MG PO TABS: 1000.0000 mg | ORAL_TABLET | Freq: Two times a day (BID) | ORAL | Status: DC
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
## 2014-09-13 NOTE — ED Notes (Signed)
Pt expressing concern with this RN about pain medication. Stating "If I don't get any relief and something for this pain, I will be back tomorrow. Tell the NP that I need stronger pain medication than Ibuprohen. This RN notified the NP.
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
## 2015-06-08 ENCOUNTER — Other Ambulatory Visit: Payer: Self-pay | Admitting: Family Medicine

## 2015-06-08 DIAGNOSIS — M7989 Other specified soft tissue disorders: Secondary | ICD-10-CM

## 2015-06-08 DIAGNOSIS — M79604 Pain in right leg: Secondary | ICD-10-CM

## 2015-06-09 ENCOUNTER — Ambulatory Visit
Admission: RE | Admit: 2015-06-09 | Discharge: 2015-06-09 | Disposition: A | Discharge status: Home / Self Care | Payer: BLUE CROSS/BLUE SHIELD | Source: Ambulatory Visit | Attending: Family Medicine | Admitting: Family Medicine

## 2015-06-09 DIAGNOSIS — M7989 Other specified soft tissue disorders: Secondary | ICD-10-CM

## 2015-06-09 DIAGNOSIS — M79604 Pain in right leg: Secondary | ICD-10-CM

## 2015-11-26 ENCOUNTER — Ambulatory Visit (HOSPITAL_COMMUNITY)
Admission: EM | Admit: 2015-11-26 | Discharge: 2015-11-26 | Disposition: A | Discharge status: Home / Self Care | Payer: BLUE CROSS/BLUE SHIELD | Attending: Emergency Medicine | Admitting: Emergency Medicine

## 2015-11-26 ENCOUNTER — Encounter (HOSPITAL_COMMUNITY): Payer: Self-pay | Admitting: *Deleted

## 2015-11-26 DIAGNOSIS — T148XXA Other injury of unspecified body region, initial encounter: Secondary | ICD-10-CM

## 2015-11-26 DIAGNOSIS — M545 Low back pain, unspecified: Secondary | ICD-10-CM

## 2015-11-26 DIAGNOSIS — M7918 Myalgia, other site: Secondary | ICD-10-CM

## 2015-11-26 DIAGNOSIS — M791 Myalgia: Secondary | ICD-10-CM

## 2015-11-26 DIAGNOSIS — S39012A Strain of muscle, fascia and tendon of lower back, initial encounter: Secondary | ICD-10-CM

## 2015-11-26 MED ORDER — DICLOFENAC POTASSIUM 50 MG PO TABS
50.0000 mg | ORAL_TABLET | Freq: Three times a day (TID) | ORAL | 0 refills | Status: DC
Start: 2015-11-26 — End: 2017-02-03

## 2015-11-26 MED ORDER — TRAMADOL HCL 50 MG PO TABS: 50.0000 mg | ORAL_TABLET | Freq: Four times a day (QID) | ORAL | 0 refills | Status: DC | PRN

## 2015-11-26 MED ORDER — METHOCARBAMOL 500 MG PO TABS: 500.0000 mg | ORAL_TABLET | Freq: Two times a day (BID) | ORAL | 0 refills | Status: DC
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
## 2015-11-26 NOTE — Discharge Instructions (Signed)
For the first couple days apply ice to the areas of soreness then switch to heat. Perform stretches as demonstrated. These are not exercises but no stretches. He will have pain over the next additional 2-3 days before gradually starting to get better. If needed follow-up with your primary care doctor. Some of the medicine you are being given may cause some drowsiness.
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
## 2015-11-28 ENCOUNTER — Encounter (HOSPITAL_COMMUNITY): Payer: Self-pay | Admitting: Emergency Medicine

## 2015-11-28 ENCOUNTER — Emergency Department (HOSPITAL_COMMUNITY)
Admission: EM | Admit: 2015-11-28 | Discharge: 2015-11-29 | Disposition: A | Discharge status: Home / Self Care | Payer: BLUE CROSS/BLUE SHIELD | Attending: Emergency Medicine | Admitting: Emergency Medicine

## 2015-11-28 ENCOUNTER — Emergency Department (HOSPITAL_COMMUNITY): Payer: BLUE CROSS/BLUE SHIELD

## 2015-11-28 DIAGNOSIS — Z79899 Other long term (current) drug therapy: Secondary | ICD-10-CM | POA: Diagnosis not present

## 2015-11-28 DIAGNOSIS — J45909 Unspecified asthma, uncomplicated: Secondary | ICD-10-CM | POA: Insufficient documentation

## 2015-11-28 DIAGNOSIS — R1030 Lower abdominal pain, unspecified: Secondary | ICD-10-CM

## 2015-11-28 DIAGNOSIS — N760 Acute vaginitis: Secondary | ICD-10-CM | POA: Insufficient documentation

## 2015-11-28 DIAGNOSIS — Z87891 Personal history of nicotine dependence: Secondary | ICD-10-CM | POA: Insufficient documentation

## 2015-11-28 LAB — COMPREHENSIVE METABOLIC PANEL
ALT: 40 U/L (ref 14–54)
AST: 29 U/L (ref 15–41)
Albumin: 3.7 g/dL (ref 3.5–5.0)
Alkaline Phosphatase: 71 U/L (ref 38–126)
Anion gap: 5 (ref 5–15)
BUN: 10 mg/dL (ref 6–20)
CHLORIDE: 110 mmol/L (ref 101–111)
CO2: 23 mmol/L (ref 22–32)
CREATININE: 0.61 mg/dL (ref 0.44–1.00)
Calcium: 9.1 mg/dL (ref 8.9–10.3)
GFR calc Af Amer: >60 mL/min (ref >60)
GLUCOSE: 107 mg/dL — AB (ref 65–99)
Potassium: 3.5 mmol/L (ref 3.5–5.1)
Sodium: 138 mmol/L (ref 135–145)
Total Bilirubin: 0.5 mg/dL (ref 0.3–1.2)
Total Protein: 6.5 g/dL (ref 6.5–8.1)

## 2015-11-28 LAB — URINALYSIS, ROUTINE W REFLEX MICROSCOPIC
GLUCOSE, UA: NEGATIVE mg/dL
HGB URINE DIPSTICK: NEGATIVE
KETONES UR: NEGATIVE mg/dL
Leukocytes, UA: NEGATIVE
Nitrite: NEGATIVE
PROTEIN: NEGATIVE mg/dL
Specific Gravity, Urine: 1.04 — ABNORMAL HIGH (ref 1.005–1.030)
pH: 5.5 (ref 5.0–8.0)

## 2015-11-28 LAB — LIPASE, BLOOD: LIPASE: 22 U/L (ref 11–51)

## 2015-11-28 LAB — CBC
HCT: 38.3 % (ref 36.0–46.0)
Hemoglobin: 13.3 g/dL (ref 12.0–15.0)
MCH: 28.9 pg (ref 26.0–34.0)
MCHC: 34.7 g/dL (ref 30.0–36.0)
MCV: 83.1 fL (ref 78.0–100.0)
PLATELETS: 171 10*3/uL (ref 150–400)
RBC: 4.61 MIL/uL (ref 3.87–5.11)
RDW: 12.6 % (ref 11.5–15.5)
WBC: 6.4 10*3/uL (ref 4.0–10.5)

## 2015-11-28 LAB — I-STAT BETA HCG BLOOD, ED (MC, WL, AP ONLY): I-stat hCG, quantitative: <5 m[IU]/mL (ref <5)

## 2015-11-28 LAB — WET PREP, GENITAL
SPERM: NONE SEEN
TRICH WET PREP: NONE SEEN
YEAST WET PREP: NONE SEEN

## 2015-11-28 MED ORDER — SODIUM CHLORIDE 0.9 % IJ SOLN
INTRAMUSCULAR | Status: AC
  Filled 2015-11-28: qty 50

## 2015-11-28 MED ORDER — IOPAMIDOL (ISOVUE-300) INJECTION 61%
100.0000 mL | Freq: Once | INTRAVENOUS | Status: AC | PRN
  Administered 2015-11-28: 100 mL via INTRAVENOUS

## 2015-11-28 MED ORDER — IOPAMIDOL (ISOVUE-300) INJECTION 61%
INTRAVENOUS | Status: AC
  Filled 2015-11-28: qty 100

## 2015-11-28 MED ORDER — MORPHINE SULFATE (PF) 4 MG/ML IV SOLN
4.0000 mg | Freq: Once | INTRAVENOUS | Status: AC
Start: 2015-11-28 — End: 2015-11-28
  Administered 2015-11-28: 4 mg via INTRAVENOUS
  Filled 2015-11-28: qty 1
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
## 2015-11-28 NOTE — ED Triage Notes (Signed)
Per pt, states she is unable to use the bathroom regularly-states she was put on Linzess-states increased lower abdominal pain-told to come to ED for CT scan

## 2015-11-29 LAB — RPR: RPR Ser Ql: NONREACTIVE

## 2015-11-29 LAB — HIV ANTIBODY (ROUTINE TESTING W REFLEX): HIV Screen 4th Generation wRfx: NONREACTIVE

## 2015-11-29 LAB — GC/CHLAMYDIA PROBE AMP (~~LOC~~) NOT AT ARMC
Chlamydia: NEGATIVE
Neisseria Gonorrhea: NEGATIVE

## 2015-11-29 MED ORDER — METRONIDAZOLE 500 MG PO TABS: 500.0000 mg | ORAL_TABLET | Freq: Two times a day (BID) | ORAL | 0 refills | Status: DC

## 2015-11-29 NOTE — Discharge Instructions (Signed)
Take the antibiotic as prescribed. Don't drink any alcohol while taking the antibiotic. Call your primary care physician to be reevaluated if he had significant discomfort by next week. Return if concern for any reason

## 2016-06-24 ENCOUNTER — Other Ambulatory Visit: Payer: Self-pay | Admitting: Family Medicine

## 2016-06-24 DIAGNOSIS — Z1231 Encounter for screening mammogram for malignant neoplasm of breast: Secondary | ICD-10-CM

## 2016-06-27 IMAGING — US US EXTREM LOW VENOUS*R*
1 series · 13 of 24 positions shown · non-contrast
Comparison: None.

CLINICAL DATA: 39-year-old female with right lower extremity pain
and swelling



[Series 1: us extrem low venous*right* · 13 of 44 slices shown]
[im 1/44]
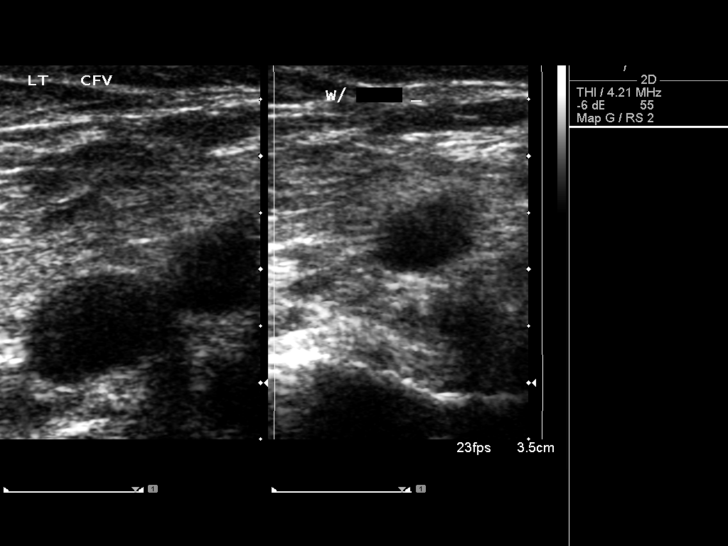
[im 4/44]
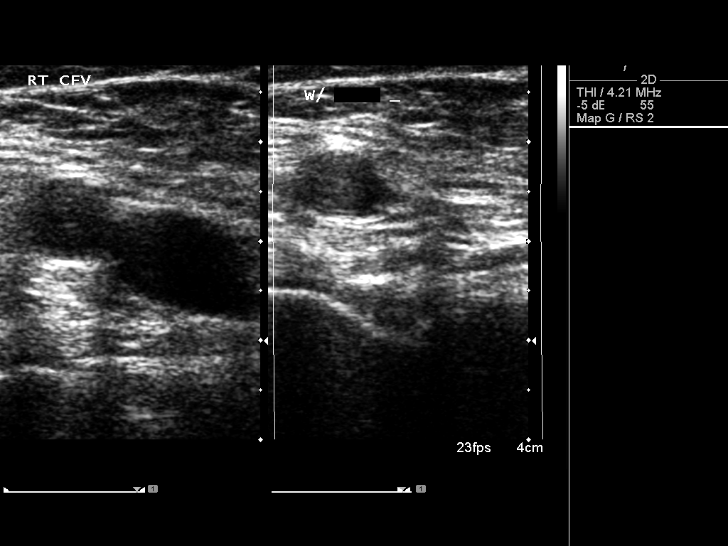
[im 8/44]
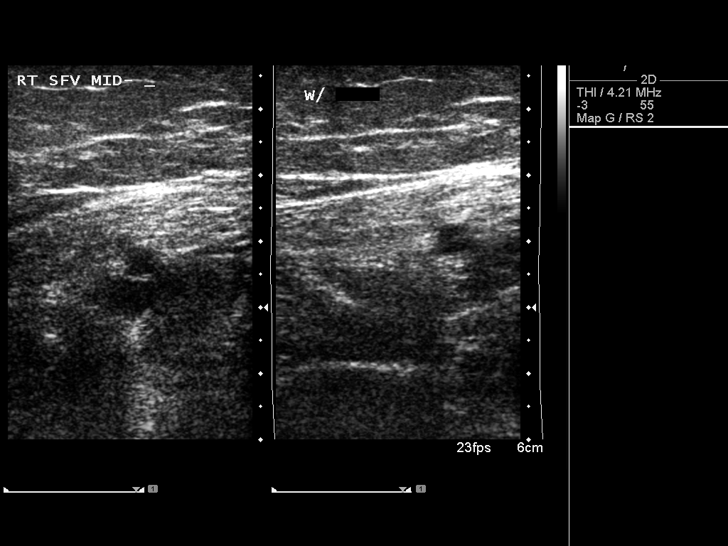
[im 12/44]
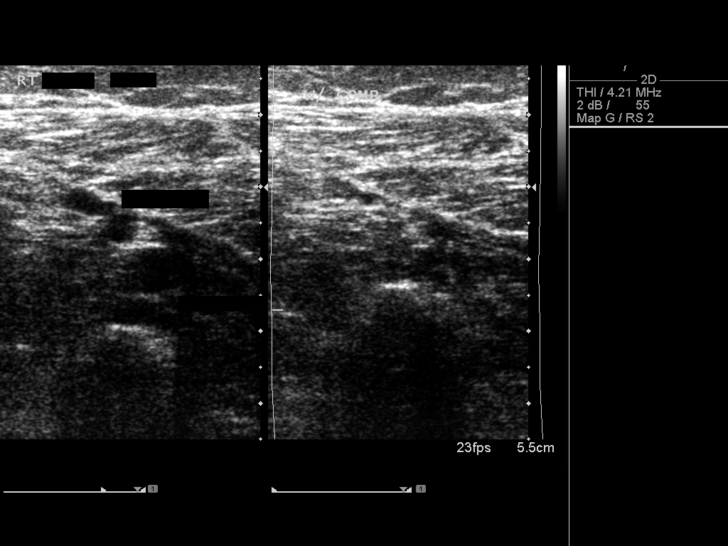
[im 15/44]
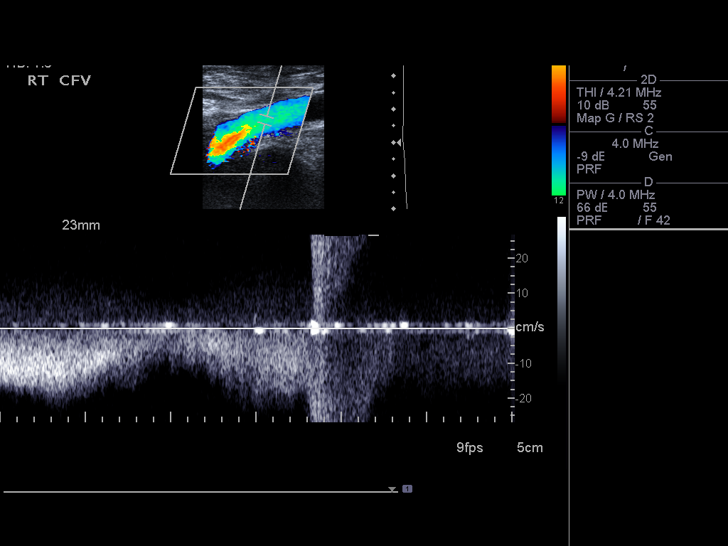
[im 19/44]
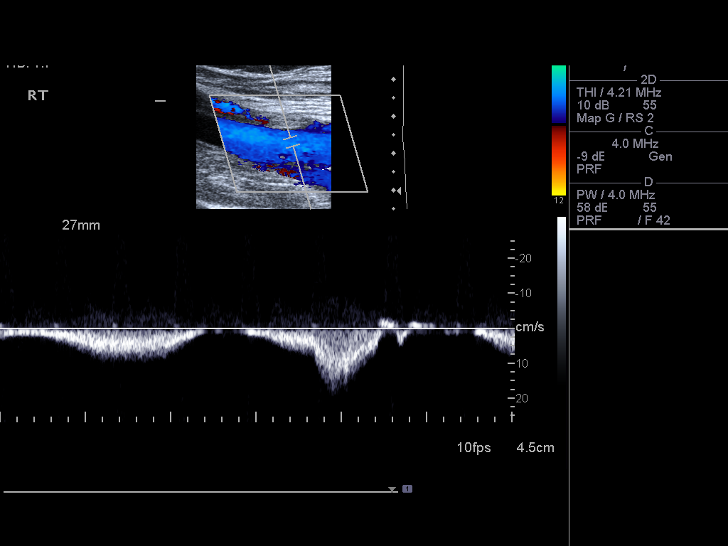
[im 23/44]
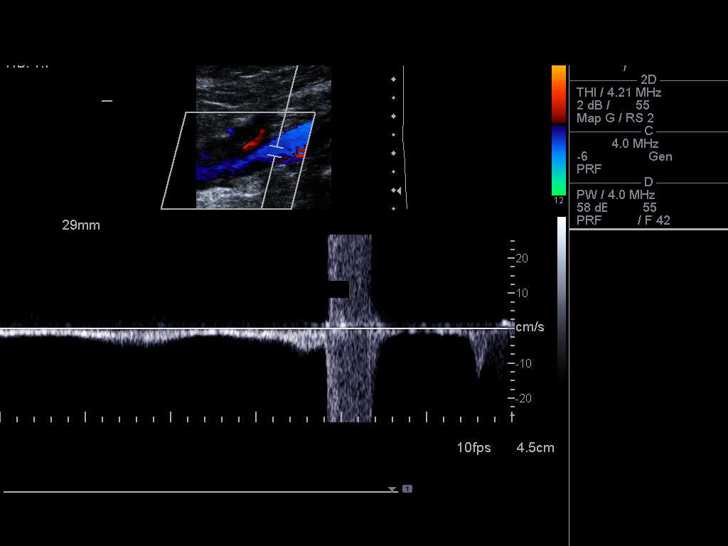
[im 25/44]
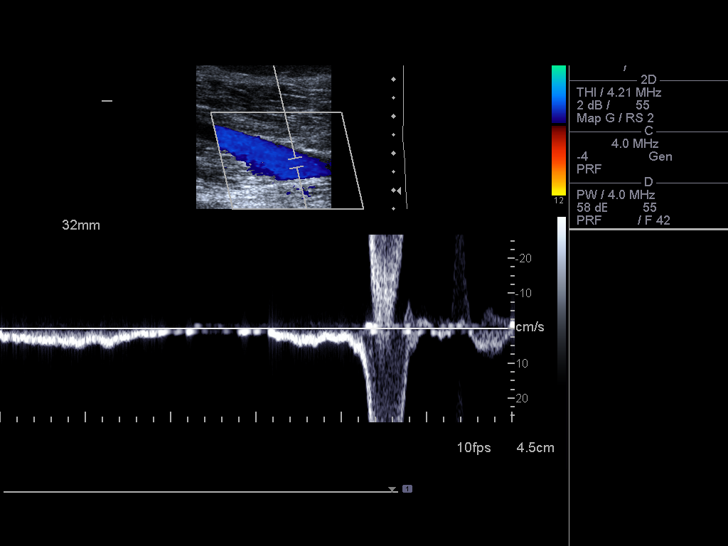
[im 29/44]
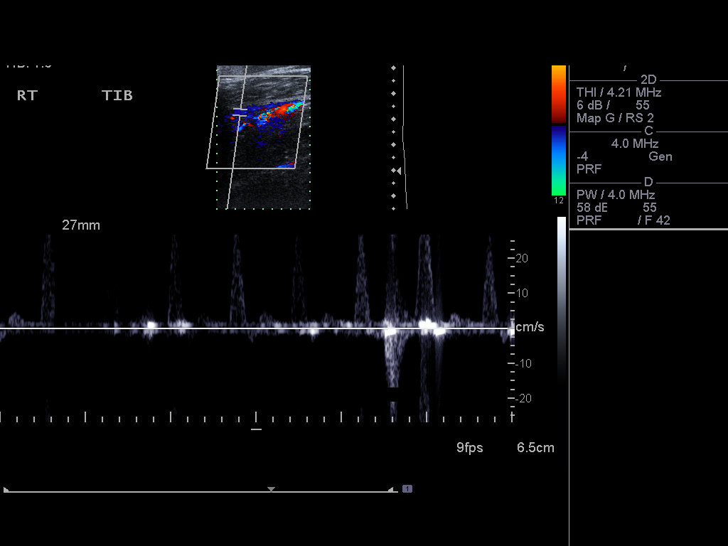
[im 32/44]
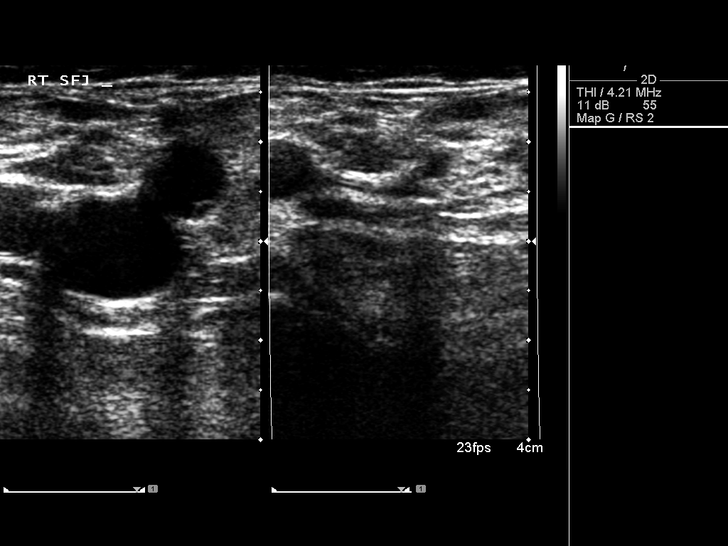
[im 36/44]
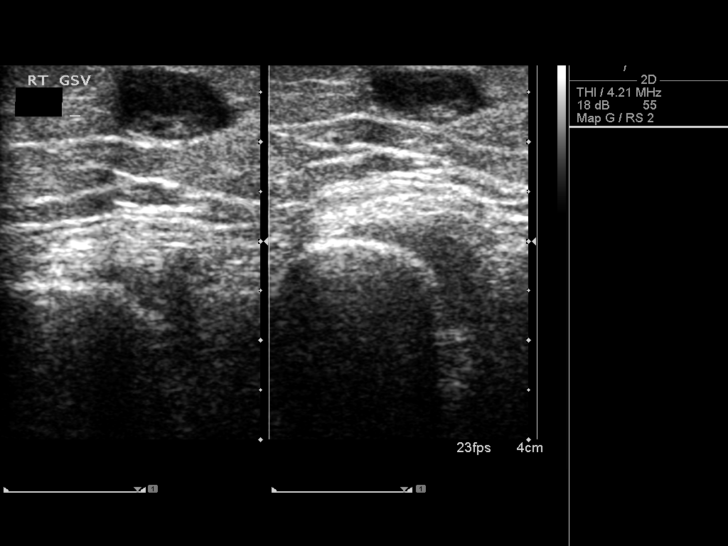
[im 40/44]
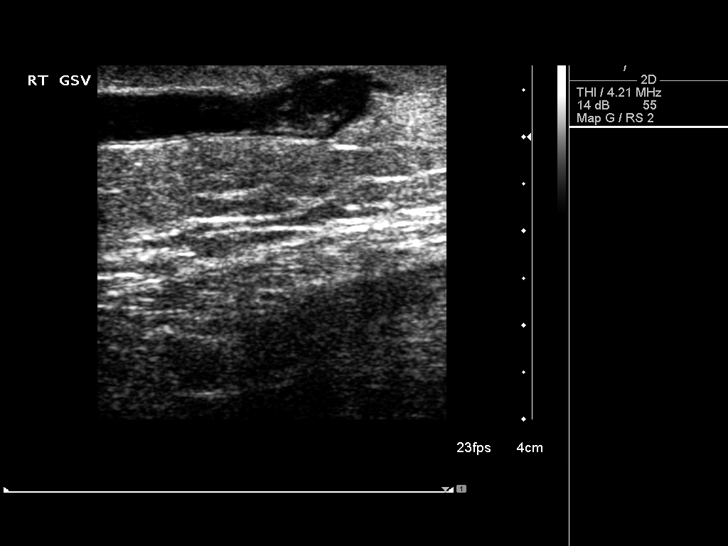
[im 44/44]
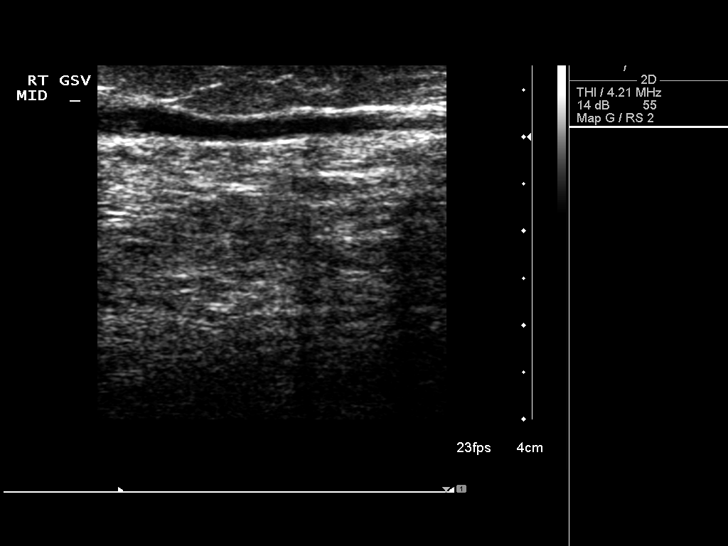

[13 of 24 positions shown; findings below may reference images not displayed]

FINDINGS: Contralateral Common Femoral Vein: Respiratory phasicity is normal
and symmetric with the symptomatic side. No evidence of thrombus.
Normal compressibility.

Common Femoral Vein: No evidence of thrombus. Normal
compressibility, respiratory phasicity and response to augmentation.

Saphenofemoral Junction: No evidence of thrombus. Normal
compressibility and flow on color Doppler imaging.

Profunda Femoral Vein: No evidence of thrombus. Normal
compressibility and flow on color Doppler imaging.

Femoral Vein: No evidence of thrombus. Normal compressibility,
respiratory phasicity and response to augmentation.

Popliteal Vein: No evidence of thrombus. Normal compressibility,
respiratory phasicity and response to augmentation.

Calf Veins: No evidence of thrombus. Normal compressibility and flow
on color Doppler imaging.

Superficial Great Saphenous Vein: Noncompressible in the lumen
expanded and full of low-level internal echoes in the proximal calf
consistent with acute thrombosis. This corresponds with the
patient's region of clinical discomfort.

Venous Reflux:  None.

Other Findings:  None.
IMPRESSION: 1. Negative for evidence of deep venous thrombosis.
2. Positive for superficial thrombosis of the great saphenous vein
in the proximal calf. This corresponds with the location of the
patient's clinical symptoms consistent with superficial
thrombophlebitis.

## 2016-07-08 ENCOUNTER — Ambulatory Visit: Payer: BLUE CROSS/BLUE SHIELD

## 2017-02-02 ENCOUNTER — Other Ambulatory Visit: Payer: Self-pay | Admitting: Family Medicine

## 2017-02-02 DIAGNOSIS — E0590 Thyrotoxicosis, unspecified without thyrotoxic crisis or storm: Secondary | ICD-10-CM

## 2017-02-02 DIAGNOSIS — R634 Abnormal weight loss: Secondary | ICD-10-CM

## 2017-02-03 ENCOUNTER — Ambulatory Visit (INDEPENDENT_AMBULATORY_CARE_PROVIDER_SITE_OTHER): Payer: BLUE CROSS/BLUE SHIELD | Admitting: Obstetrics & Gynecology

## 2017-02-03 ENCOUNTER — Encounter: Payer: Self-pay | Admitting: Obstetrics & Gynecology

## 2017-02-03 VITALS — BP 101/60 | HR 90 | Wt 132.0 lb

## 2017-02-03 DIAGNOSIS — Z30432 Encounter for removal of intrauterine contraceptive device: Secondary | ICD-10-CM

## 2017-02-03 DIAGNOSIS — Z975 Presence of (intrauterine) contraceptive device: Secondary | ICD-10-CM

## 2017-02-03 NOTE — Progress Notes (Signed)
Pt stated having heavy bleeding for about 1 week.

## 2017-02-03 NOTE — Progress Notes (Signed)
    GYNECOLOGY OFFICE PROCEDURE NOTE  Sarah Turner is a 42 y.o. [id number] here for IUD removal. No GYN concerns.  Last pap smear was on 2018 and was normal.  IUD Removal  Patient identified, informed consent performed, consent signed.  Patient was in the dorsal lithotomy position, normal external genitalia was noted.  A speculum was placed in the patient's vagina, normal discharge was noted, no lesions. The cervix was visualized, no lesions, no abnormal discharge.  The strings of the IUD were grasped and pulled using ring forceps. The IUD was removed in its entirety. Patient tolerated the procedure well.    Patient will use condoms for contraception/. She is unsure about Nexplanon. She will quit smoking Routine preventative health maintenance measures emphasized.   Adam PhenixArnold, Edna Rede G, MD  Obstetrician & Gynecologist, [facility], [facility]

## 2017-02-03 NOTE — Patient Instructions (Signed)
Etonogestrel implant What is this medicine? ETONOGESTREL (et oh noe JES trel) is a contraceptive (birth control) device. It is used to prevent pregnancy. It can be used for up to 3 years. This medicine may be used for other purposes; ask your health care provider or pharmacist if you have questions. COMMON BRAND NAME(S): Implanon, Nexplanon What should I tell my health care provider before I take this medicine? They need to know if you have any of these conditions: -abnormal vaginal bleeding -blood vessel disease or blood clots -cancer of the breast, cervix, or liver -depression -diabetes -gallbladder disease -headaches -heart disease or recent heart attack -high blood pressure -high cholesterol -kidney disease -liver disease -renal disease -seizures -tobacco smoker -an unusual or allergic reaction to etonogestrel, other hormones, anesthetics or antiseptics, medicines, foods, dyes, or preservatives -pregnant or trying to get pregnant -breast-feeding How should I use this medicine? This device is inserted just under the skin on the inner side of your upper arm by a health care professional. Talk to your pediatrician regarding the use of this medicine in children. Special care may be needed. Overdosage: If you think you have taken too much of this medicine contact a poison control center or emergency room at once. NOTE: This medicine is only for you. Do not share this medicine with others. What if I miss a dose? This does not apply. What may interact with this medicine? Do not take this medicine with any of the following medications: -amprenavir -bosentan -fosamprenavir This medicine may also interact with the following medications: -barbiturate medicines for inducing sleep or treating seizures -certain medicines for fungal infections like ketoconazole and itraconazole -grapefruit juice -griseofulvin -medicines to treat seizures like carbamazepine, felbamate, oxcarbazepine,  phenytoin, topiramate -modafinil -phenylbutazone -rifampin -rufinamide -some medicines to treat HIV infection like atazanavir, indinavir, lopinavir, nelfinavir, tipranavir, ritonavir -St. John's wort This list may not describe all possible interactions. Give your health care provider a list of all the medicines, herbs, non-prescription drugs, or dietary supplements you use. Also tell them if you smoke, drink alcohol, or use illegal drugs. Some items may interact with your medicine. What should I watch for while using this medicine? This product does not protect you against HIV infection (AIDS) or other sexually transmitted diseases. You should be able to feel the implant by pressing your fingertips over the skin where it was inserted. Contact your doctor if you cannot feel the implant, and use a non-hormonal birth control method (such as condoms) until your doctor confirms that the implant is in place. If you feel that the implant may have broken or become bent while in your arm, contact your healthcare provider. What side effects may I notice from receiving this medicine? Side effects that you should report to your doctor or health care professional as soon as possible: -allergic reactions like skin rash, itching or hives, swelling of the face, lips, or tongue -breast lumps -changes in emotions or moods -depressed mood -heavy or prolonged menstrual bleeding -pain, irritation, swelling, or bruising at the insertion site -scar at site of insertion -signs of infection at the insertion site such as fever, and skin redness, pain or discharge -signs of pregnancy -signs and symptoms of a blood clot such as breathing problems; changes in vision; chest pain; severe, sudden headache; pain, swelling, warmth in the leg; trouble speaking; sudden numbness or weakness of the face, arm or leg -signs and symptoms of liver injury like dark yellow or brown urine; general ill feeling or flu-like symptoms;  light-colored   stools; loss of appetite; nausea; right upper belly pain; unusually weak or tired; yellowing of the eyes or skin -unusual vaginal bleeding, discharge -signs and symptoms of a stroke like changes in vision; confusion; trouble speaking or understanding; severe headaches; sudden numbness or weakness of the face, arm or leg; trouble walking; dizziness; loss of balance or coordination Side effects that usually do not require medical attention (report to your doctor or health care professional if they continue or are bothersome): -acne -back pain -breast pain -changes in weight -dizziness -general ill feeling or flu-like symptoms -headache -irregular menstrual bleeding -nausea -sore throat -vaginal irritation or inflammation This list may not describe all possible side effects. Call your doctor for medical advice about side effects. You may report side effects to FDA at 1-800-FDA-1088. Where should I keep my medicine? This drug is given in a hospital or clinic and will not be stored at home. NOTE: This sheet is a summary. It may not cover all possible information. If you have questions about this medicine, talk to your doctor, pharmacist, or health care provider.  2018 Elsevier/Gold Standard (2015-07-13 11:19:22)  

## 2017-02-11 ENCOUNTER — Encounter: Payer: Self-pay | Admitting: *Deleted

## 2017-02-11 ENCOUNTER — Ambulatory Visit
Admission: RE | Admit: 2017-02-11 | Discharge: 2017-02-11 | Disposition: A | Discharge status: Home / Self Care | Payer: BLUE CROSS/BLUE SHIELD | Source: Ambulatory Visit | Attending: Family Medicine | Admitting: Family Medicine

## 2017-02-11 DIAGNOSIS — R634 Abnormal weight loss: Secondary | ICD-10-CM

## 2017-02-11 DIAGNOSIS — E0590 Thyrotoxicosis, unspecified without thyrotoxic crisis or storm: Secondary | ICD-10-CM

## 2017-05-08 ENCOUNTER — Other Ambulatory Visit (HOSPITAL_COMMUNITY): Payer: Self-pay | Admitting: Endocrinology

## 2017-05-08 DIAGNOSIS — E0590 Thyrotoxicosis, unspecified without thyrotoxic crisis or storm: Secondary | ICD-10-CM

## 2017-05-08 DIAGNOSIS — E0500 Thyrotoxicosis with diffuse goiter without thyrotoxic crisis or storm: Secondary | ICD-10-CM

## 2017-05-22 ENCOUNTER — Other Ambulatory Visit (HOSPITAL_COMMUNITY): Payer: BLUE CROSS/BLUE SHIELD

## 2017-05-22 ENCOUNTER — Encounter (HOSPITAL_COMMUNITY): Payer: BLUE CROSS/BLUE SHIELD

## 2017-05-26 ENCOUNTER — Ambulatory Visit (HOSPITAL_COMMUNITY)
Admission: RE | Admit: 2017-05-26 | Discharge: 2017-05-26 | Disposition: A | Discharge status: Home / Self Care | Payer: BLUE CROSS/BLUE SHIELD | Source: Ambulatory Visit | Attending: Endocrinology | Admitting: Endocrinology

## 2017-05-26 ENCOUNTER — Encounter (HOSPITAL_COMMUNITY): Payer: BLUE CROSS/BLUE SHIELD

## 2017-05-26 ENCOUNTER — Encounter (HOSPITAL_COMMUNITY)
Admission: RE | Admit: 2017-05-26 | Discharge: 2017-05-26 | Disposition: A | Discharge status: Home / Self Care | Payer: BLUE CROSS/BLUE SHIELD | Source: Ambulatory Visit | Attending: Endocrinology | Admitting: Endocrinology

## 2017-05-26 ENCOUNTER — Encounter (HOSPITAL_COMMUNITY): Payer: Self-pay

## 2017-05-26 DIAGNOSIS — E0500 Thyrotoxicosis with diffuse goiter without thyrotoxic crisis or storm: Secondary | ICD-10-CM

## 2017-05-26 DIAGNOSIS — E0590 Thyrotoxicosis, unspecified without thyrotoxic crisis or storm: Secondary | ICD-10-CM

## 2017-05-26 DIAGNOSIS — D0500 Lobular carcinoma in situ of unspecified breast: Secondary | ICD-10-CM | POA: Insufficient documentation

## 2017-05-26 MED ORDER — SODIUM IODIDE I-123 7.4 MBQ CAPS
400.0000 | ORAL_CAPSULE | Freq: Once | ORAL | Status: AC
  Administered 2017-05-26: 400 via ORAL

## 2017-05-27 ENCOUNTER — Other Ambulatory Visit (HOSPITAL_COMMUNITY): Payer: BLUE CROSS/BLUE SHIELD

## 2017-05-27 ENCOUNTER — Encounter (HOSPITAL_COMMUNITY)
Admission: RE | Admit: 2017-05-27 | Discharge: 2017-05-27 | Disposition: A | Discharge status: Home / Self Care | Payer: BLUE CROSS/BLUE SHIELD | Source: Ambulatory Visit | Attending: Endocrinology | Admitting: Endocrinology

## 2017-06-27 ENCOUNTER — Other Ambulatory Visit (HOSPITAL_COMMUNITY): Payer: Self-pay | Admitting: Endocrinology

## 2017-06-27 DIAGNOSIS — E0590 Thyrotoxicosis, unspecified without thyrotoxic crisis or storm: Secondary | ICD-10-CM

## 2017-07-11 ENCOUNTER — Encounter (HOSPITAL_COMMUNITY): Payer: Self-pay | Admitting: Radiology

## 2017-07-11 ENCOUNTER — Encounter (HOSPITAL_COMMUNITY)
Admission: RE | Admit: 2017-07-11 | Discharge: 2017-07-11 | Disposition: A | Discharge status: Home / Self Care | Payer: BLUE CROSS/BLUE SHIELD | Source: Ambulatory Visit | Attending: Endocrinology | Admitting: Endocrinology

## 2017-07-11 DIAGNOSIS — E0590 Thyrotoxicosis, unspecified without thyrotoxic crisis or storm: Secondary | ICD-10-CM | POA: Insufficient documentation

## 2017-07-11 LAB — HCG, SERUM, QUALITATIVE: PREG SERUM: NEGATIVE

## 2017-07-11 MED ORDER — SODIUM IODIDE I 131 CAPSULE
15.9000 | Freq: Once | INTRAVENOUS | Status: AC | PRN
  Administered 2017-07-11: 15.9 via ORAL

## 2019-08-31 IMAGING — US US THYROID
1 series · 14 of 25 positions shown · non-contrast
Comparison: None.

CLINICAL DATA: Hyperthyroid. 41-year-old female with
hyperthyroidism, weight loss

EXAM:
THYROID ULTRASOUND
TECHNIQUE: Ultrasound examination of the thyroid gland and adjacent soft
tissues was performed.

[Series 1: us thyroid · 0.06mm/px · 14 of 44 slices shown]
[im 1/44]
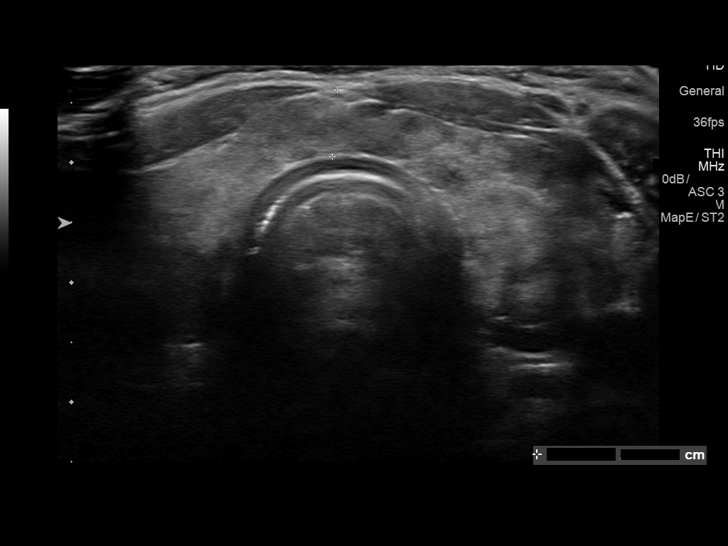
[im 4/44]
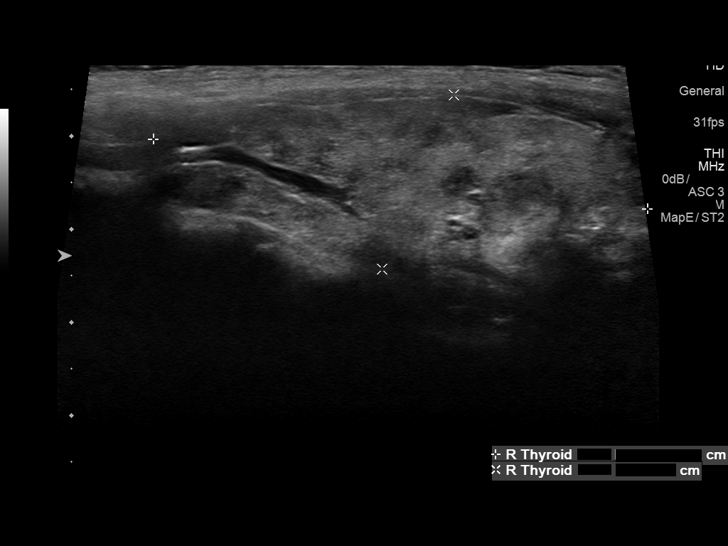
[im 8/44]
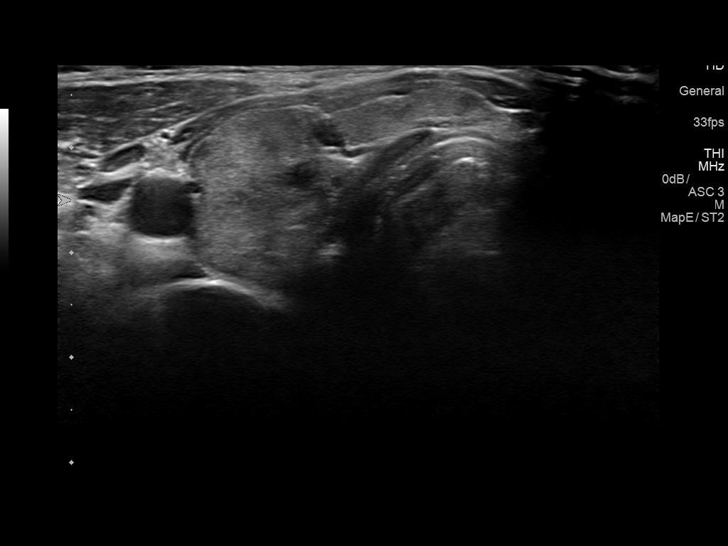
[im 11/44]
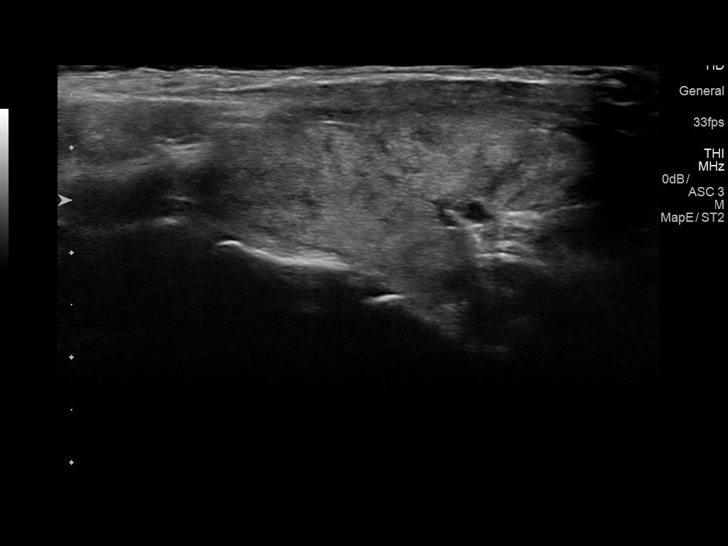
[im 15/44]
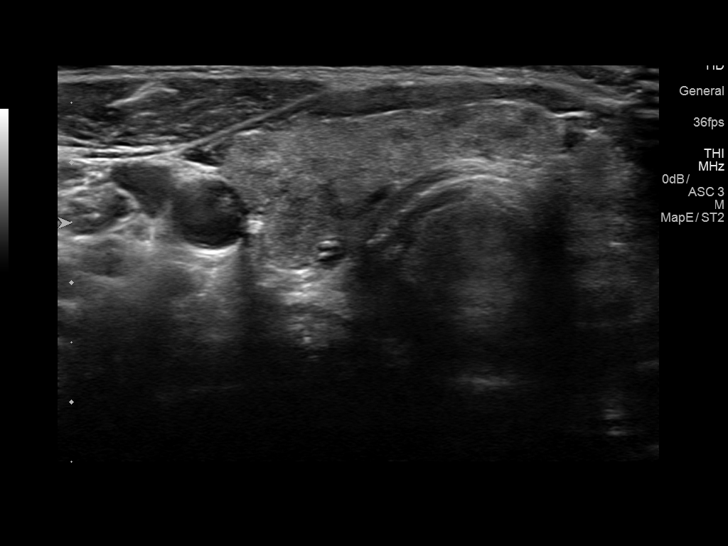
[im 17/44]
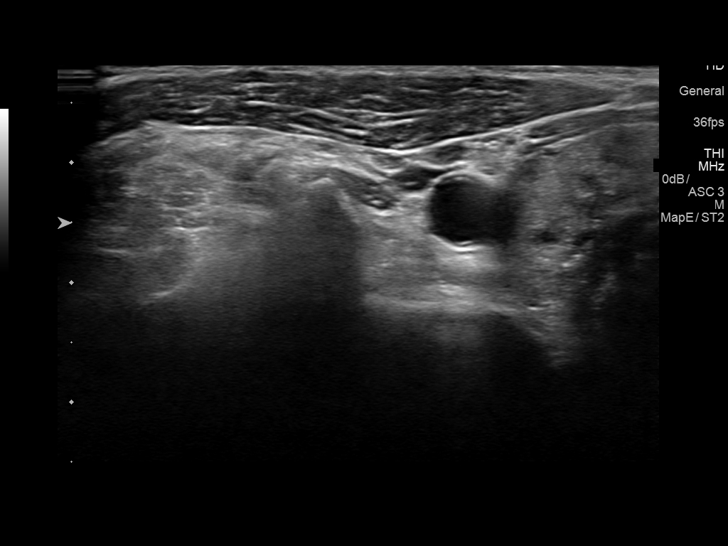
[im 20/44]
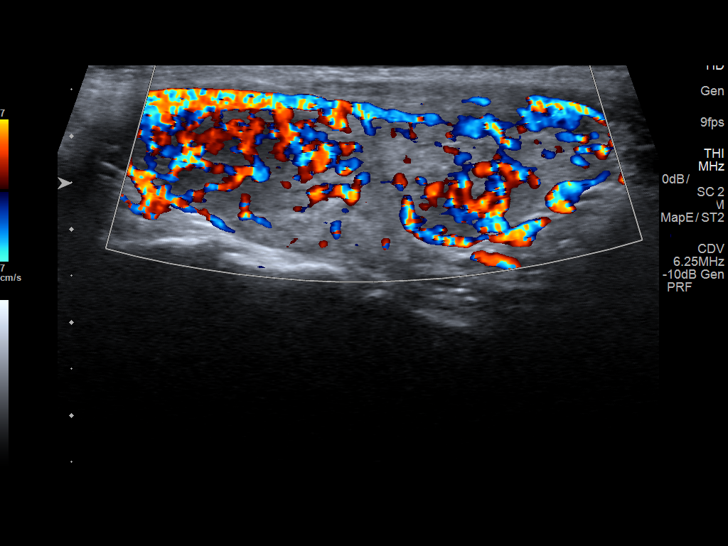
[im 24/44]
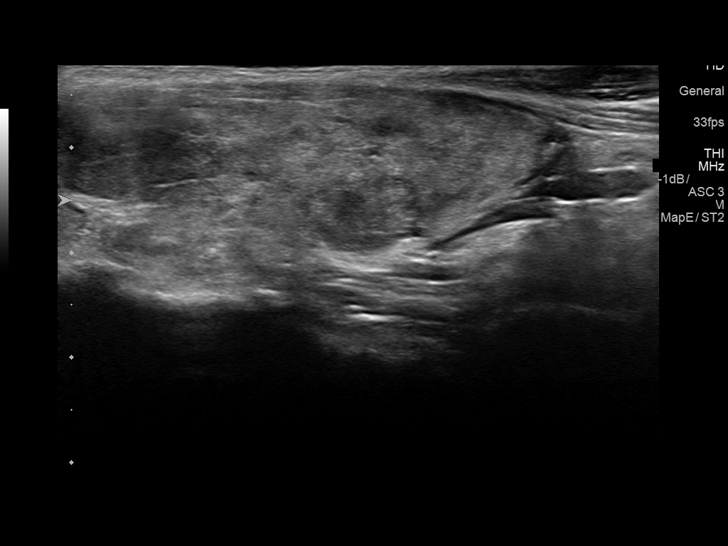
[im 27/44]
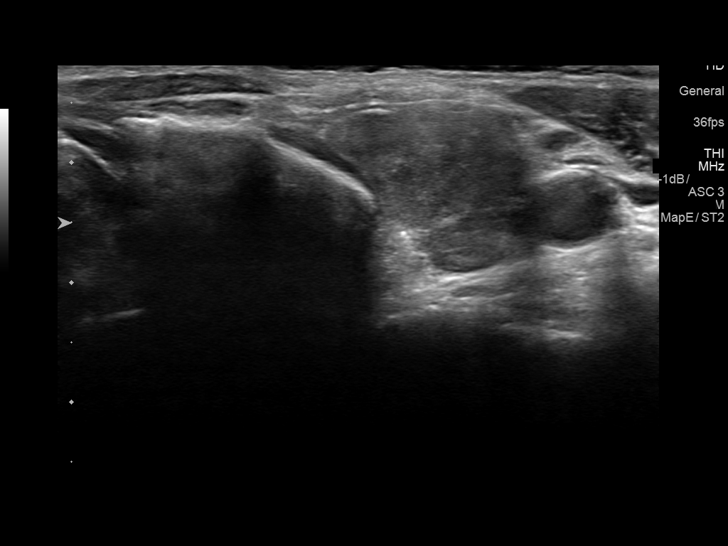
[im 29/44]
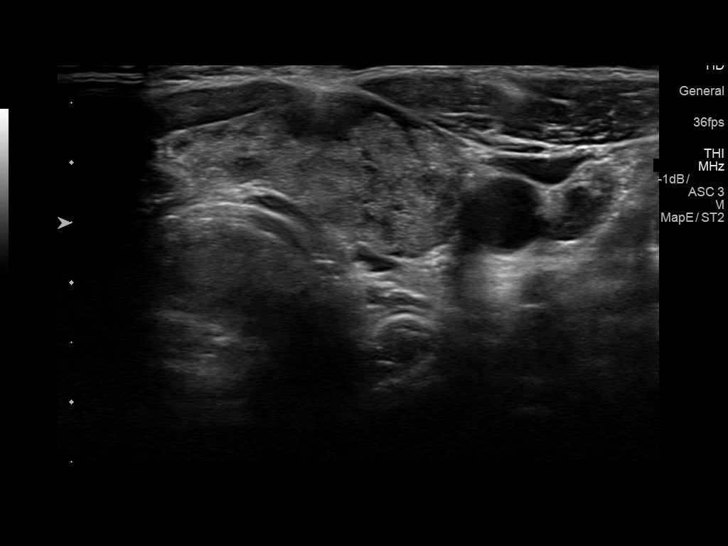
[im 33/44]
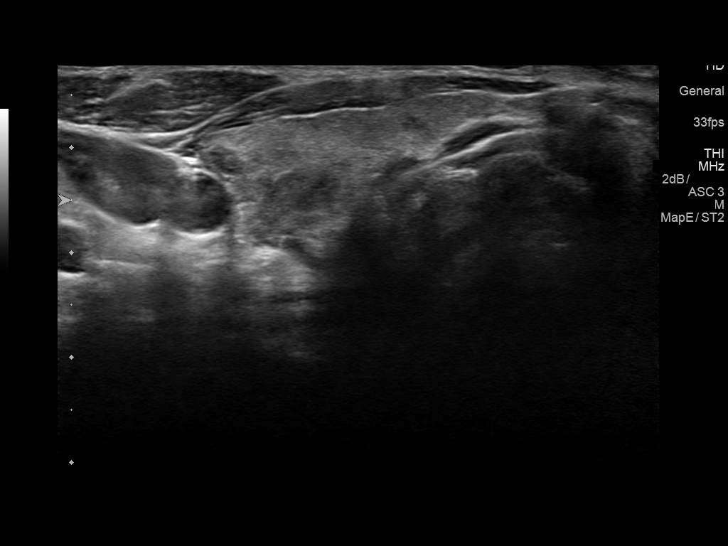
[im 36/44]
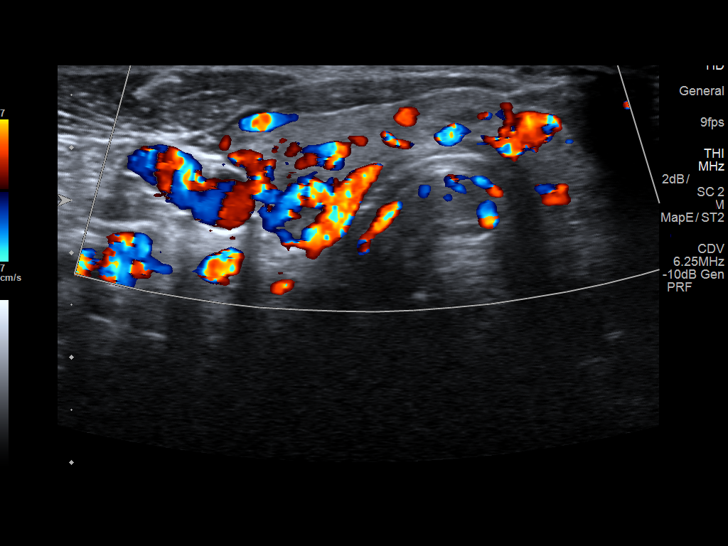
[im 40/44]
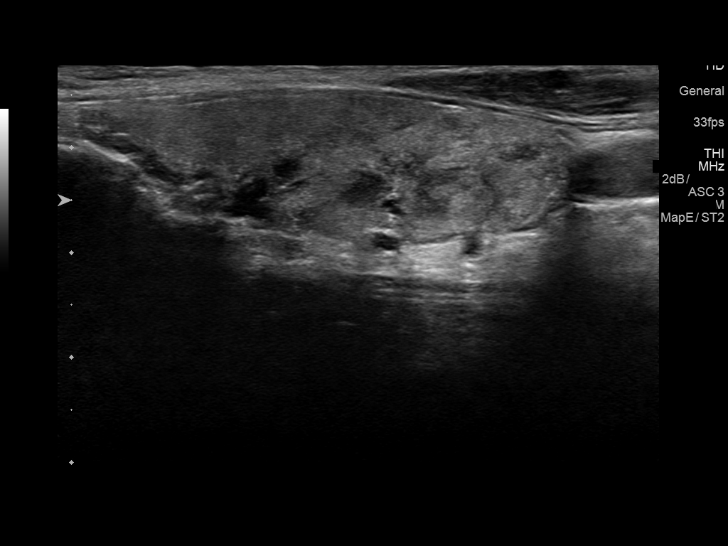
[im 44/44]
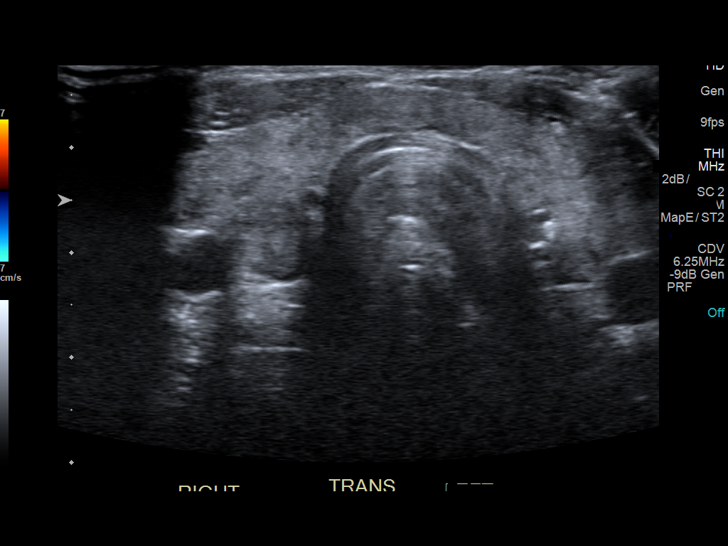

[14 of 25 positions shown; findings below may reference images not displayed]

FINDINGS: Parenchymal Echotexture: Moderately heterogenous

Isthmus: 0.6 cm

Right lobe: 5.4 x 2.0 x 1.6 cm

Left lobe: 5.4 x 1.8 x 1.8 cm

_________________________________________________________

Estimated total number of nodules >/= 1 cm: 0

Number of spongiform nodules >/=  2 cm not described below (TR1): 0

Number of mixed cystic and solid nodules >/= 1.5 cm not described
below (TR2): 0

_________________________________________________________

No discrete nodules are seen separate from the heterogeneous
background parenchyma within the thyroid gland.
IMPRESSION: Heterogeneous and slightly enlarged thyroid gland. No discrete
nodules.

## 2023-02-18 ENCOUNTER — Emergency Department (HOSPITAL_COMMUNITY)
Admission: EM | Admit: 2023-02-18 | Discharge: 2023-02-18 | Disposition: A | Discharge status: Home / Self Care | Payer: No Typology Code available for payment source | Attending: Emergency Medicine | Admitting: Emergency Medicine

## 2023-02-18 ENCOUNTER — Encounter (HOSPITAL_COMMUNITY): Payer: Self-pay

## 2023-02-18 ENCOUNTER — Telehealth (HOSPITAL_COMMUNITY): Payer: Self-pay | Admitting: Emergency Medicine

## 2023-02-18 DIAGNOSIS — Z760 Encounter for issue of repeat prescription: Secondary | ICD-10-CM | POA: Diagnosis not present

## 2023-02-18 DIAGNOSIS — Z202 Contact with and (suspected) exposure to infections with a predominantly sexual mode of transmission: Secondary | ICD-10-CM | POA: Diagnosis not present

## 2023-02-18 DIAGNOSIS — N898 Other specified noninflammatory disorders of vagina: Secondary | ICD-10-CM | POA: Diagnosis present

## 2023-02-18 LAB — URINALYSIS, ROUTINE W REFLEX MICROSCOPIC
Bacteria, UA: NONE SEEN
Bilirubin Urine: NEGATIVE
Glucose, UA: NEGATIVE mg/dL
Ketones, ur: NEGATIVE mg/dL
Leukocytes,Ua: NEGATIVE
Nitrite: NEGATIVE
Protein, ur: NEGATIVE mg/dL
Specific Gravity, Urine: 1.02 (ref 1.005–1.030)
pH: 5 (ref 5.0–8.0)

## 2023-02-18 LAB — HCG, SERUM, QUALITATIVE: Preg, Serum: NEGATIVE

## 2023-02-18 LAB — HIV ANTIBODY (ROUTINE TESTING W REFLEX): HIV Screen 4th Generation wRfx: NONREACTIVE

## 2023-02-18 LAB — WET PREP, GENITAL
Clue Cells Wet Prep HPF POC: NONE SEEN
Sperm: NONE SEEN
Trich, Wet Prep: NONE SEEN
WBC, Wet Prep HPF POC: <10 (ref <10)

## 2023-02-18 MED ORDER — DOXYCYCLINE HYCLATE 100 MG PO TABS
100.0000 mg | ORAL_TABLET | Freq: Once | ORAL | Status: AC
  Administered 2023-02-18: 100 mg via ORAL
  Filled 2023-02-18: qty 1

## 2023-02-18 MED ORDER — DOXYCYCLINE HYCLATE 100 MG PO CAPS: 100.0000 mg | ORAL_CAPSULE | Freq: Two times a day (BID) | ORAL | 0 refills | Status: DC

## 2023-02-18 MED ORDER — LEVOTHYROXINE SODIUM 137 MCG PO TABS: 137.0000 ug | ORAL_TABLET | Freq: Every day | ORAL | 0 refills | Status: AC

## 2023-02-18 MED ORDER — DOXYCYCLINE HYCLATE 100 MG PO CAPS: 100.0000 mg | ORAL_CAPSULE | Freq: Two times a day (BID) | ORAL | 0 refills | Status: AC

## 2023-02-18 MED ORDER — LORATADINE 10 MG PO TABS
10.0000 mg | ORAL_TABLET | Freq: Once | ORAL | Status: AC
  Administered 2023-02-18: 10 mg via ORAL
  Filled 2023-02-18: qty 1

## 2023-02-18 MED ORDER — PENICILLIN G BENZATHINE 1200000 UNIT/2ML IM SUSY
1.2000 10*6.[IU] | PREFILLED_SYRINGE | Freq: Once | INTRAMUSCULAR | Status: AC
  Administered 2023-02-18: 1.2 10*6.[IU] via INTRAMUSCULAR
  Filled 2023-02-18: qty 2

## 2023-02-18 MED ORDER — CEFTRIAXONE SODIUM 1 G IJ SOLR
500.0000 mg | Freq: Once | INTRAMUSCULAR | Status: AC
  Administered 2023-02-18: 500 mg via INTRAMUSCULAR
  Filled 2023-02-18: qty 10

## 2023-02-18 MED ORDER — LEVOTHYROXINE SODIUM 137 MCG PO TABS: 137.0000 ug | ORAL_TABLET | Freq: Every day | ORAL | 0 refills | Status: DC

## 2023-02-18 MED ORDER — LIDOCAINE HCL 1 % IJ SOLN
INTRAMUSCULAR | Status: AC
Start: 2023-02-18 — End: 2023-02-18
  Administered 2023-02-18: 20 mL
  Filled 2023-02-18: qty 20

## 2023-02-18 NOTE — ED Triage Notes (Signed)
Pt reports that she is from out of town and just needs a refill of her synthroid medication. Pt also reports vaginal discharge that started a few weeks ago, clear in color, no odor.

## 2023-02-18 NOTE — ED Provider Notes (Signed)
[place] EMERGENCY DEPARTMENT AT [facility] Provider Note   CSN: [id number] Arrival date & time: 02/18/23  0751     History  Chief Complaint  Patient presents with   Medication Refill   Sarah Turner    [name] is a 48 y.o. female.  With a history of hypothyroidism who presents for Sarah Turner.  Patient has relocated to the area from [facility].  She is out of her Synthroid 137 mcg daily.  Requesting refill.  Secondary complaint of potential STI exposure.  A recent sexual partner with whom she had unprotected Sarah sex told her to get tested for syphilis.  She has had some clear Sarah Turner.  No rashes fevers chills pelvic pain or other symptoms at this time.  No prior history of STI   Medication Refill Sarah Turner      Home Medications Prior to Admission medications   Medication Sig Start Date End Date Taking? Authorizing Provider  doxycycline (VIBRAMYCIN) 100 MG capsule Take 1 capsule (100 mg total) by mouth 2 (two) times daily for 7 days. 02/18/23 02/25/23 Yes Royanne Foots, DO  levothyroxine (SYNTHROID) 137 MCG tablet Take 1 tablet (137 mcg total) by mouth daily before breakfast. 02/18/23  Yes Royanne Foots, DO  ciprofloxacin (CIPRO) 250 MG tablet  11/20/15   [provider]  clonazePAM (KLONOPIN) 1 MG tablet TK 1 T PO TID 01/11/17   [provider]  ibuprofen (ADVIL,MOTRIN) 800 MG tablet Take 1 tablet (800 mg total) by mouth 3 (three) times daily. 09/13/14   Elpidio Anis, PA-C  levonorgestrel (MIRENA) 20 MCG/24HR IUD 1 Intra Uterine Device (1 each total) by Intrauterine route once. 07/17/11 07/16/12  Adam Phenix, MD  oxybutynin (DITROPAN-XL) 5 MG 24 hr tablet TK 1 T PO QD 01/11/17   [provider]      Allergies    Amoxicillin    Review of Systems   Review of Systems  Genitourinary:  Positive for Sarah Turner.    Physical Exam Updated Vital Signs BP (!) 132/93   Pulse 86   Temp 98  F (36.7 C)   Resp 18   LMP 12/22/2022 (Approximate)   SpO2 97%  Physical Exam Vitals and nursing note reviewed.  HENT:     Head: Normocephalic and atraumatic.  Eyes:     Pupils: Pupils are equal, round, and reactive to light.  Cardiovascular:     Rate and Rhythm: Normal rate and regular rhythm.  Pulmonary:     Effort: Pulmonary effort is normal.     Breath sounds: Normal breath sounds.  Abdominal:     Palpations: Abdomen is soft.     Tenderness: There is no abdominal tenderness.  Genitourinary:    Comments: Patient deferred for self swab Skin:    General: Skin is warm and dry.  Neurological:     Mental Status: She is alert.  Psychiatric:        Mood and Affect: Mood normal.     ED Results / Procedures / Treatments   Labs (all labs ordered are listed, but only abnormal results are displayed) Labs Reviewed  WET PREP, GENITAL  RPR  URINALYSIS, ROUTINE W REFLEX MICROSCOPIC  HIV ANTIBODY (ROUTINE TESTING W REFLEX)  HCG, SERUM, QUALITATIVE  GC/CHLAMYDIA PROBE AMP ([place]) NOT AT [facility]    EKG None  Radiology No results found.  Procedures Procedures    Medications Ordered in ED Medications  penicillin g benzathine (BICILLIN LA) 1200000 UNIT/2ML  injection 1.2 Million Units (has no administration in time range)  loratadine (CLARITIN) tablet 10 mg (has no administration in time range)  cefTRIAXone (ROCEPHIN) injection 500 mg (has no administration in time range)  doxycycline (VIBRA-TABS) tablet 100 mg (has no administration in time range)    ED Course/ Medical Decision Making/ A&P                                 Medical Decision Making 48 year old female with history as above requesting medication refill for Synthroid which I will provide with for her.  Secondary complaint of Sarah Turner and recent STI exposure to syphilis.  Will obtain UA, pregnancy test, STI panel including HIV and syphilis.  Will treat empirically for gonorrhea chlamydia along with  syphilis.  She has a history of itching to amoxicillin but no severe anaphylaxis or allergic reactions.  Will provide her with loratadine to help with the itching should she develop this side effect.  Will call in doxycycline 100 mg p.o. twice daily for 7 days to treat empirically for chlamydia and she will follow-up on the results of all of her testing  Amount and/or Complexity of Data Reviewed Labs: ordered.  Risk OTC drugs. Prescription drug management.           Final Clinical Impression(s) / ED Diagnoses Final diagnoses:  Medication refill  Exposure to STD    Rx / DC Orders ED Turner Orders          Ordered    doxycycline (VIBRAMYCIN) 100 MG capsule  2 times daily        02/18/23 0924    levothyroxine (SYNTHROID) 137 MCG tablet  Daily before breakfast        02/18/23 0924              Royanne Foots, DO 02/18/23 [phone]

## 2023-02-18 NOTE — Telephone Encounter (Signed)
Changed pharmacy for prescriptions

## 2023-02-18 NOTE — Discharge Instructions (Signed)
You are seen in the emergency for medication refill We called in a refill for your Synthroid 137 to [facility] You also were concerned about a potential exposure to syphilis We tested you for syphilis gonorrhea chlamydia HIV and other vaginal infections The results of these tests are still pending We went ahead and treated you prophylactically for syphilis gonorrhea and chlamydia You will need to pick up doxycycline from your [facility] pharmacy as well and begin taking this twice a day for the next week if your chlamydia test is positive Follow-up on the results of your testing using the MyChart app Return to the emergency department for worsening symptoms or any other concerns

## 2023-02-19 LAB — GC/CHLAMYDIA PROBE AMP (~~LOC~~) NOT AT ARMC
Chlamydia: NEGATIVE
Comment: NEGATIVE
Comment: NORMAL
Neisseria Gonorrhea: NEGATIVE

## 2023-02-19 LAB — RPR
RPR Ser Ql: REACTIVE — AB
RPR Titer: 1:64 {titer}

## 2023-02-20 LAB — T.PALLIDUM AB, TOTAL: T Pallidum Abs: REACTIVE — AB
